# Patient Record
Sex: Female | Born: 1985 | Race: Black or African American | Hispanic: No | Marital: Single | Smoking: Current every day smoker
Health system: Southern US, Community
[De-identification: ages and names within clinical notes are randomized; demographics above are authoritative.]

## PROBLEM LIST (undated history)

## (undated) ENCOUNTER — Inpatient Hospital Stay (HOSPITAL_COMMUNITY): Payer: Self-pay

## (undated) DIAGNOSIS — R519 Headache, unspecified: Secondary | ICD-10-CM

## (undated) DIAGNOSIS — R51 Headache: Secondary | ICD-10-CM

## (undated) DIAGNOSIS — D649 Anemia, unspecified: Secondary | ICD-10-CM

## (undated) DIAGNOSIS — Z789 Other specified health status: Secondary | ICD-10-CM

---
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
## 2005-01-11 ENCOUNTER — Emergency Department (HOSPITAL_COMMUNITY): Admission: EM | Admit: 2005-01-11 | Discharge: 2005-01-11 | Payer: Self-pay | Admitting: Emergency Medicine

## 2005-02-09 ENCOUNTER — Other Ambulatory Visit: Admission: RE | Admit: 2005-02-09 | Discharge: 2005-02-09 | Payer: Self-pay | Admitting: Obstetrics and Gynecology

## 2005-06-29 ENCOUNTER — Inpatient Hospital Stay (HOSPITAL_COMMUNITY): Admission: AD | Admit: 2005-06-29 | Discharge: 2005-07-02 | Payer: Self-pay | Admitting: Obstetrics and Gynecology

## 2005-06-30 DIAGNOSIS — O1490 Unspecified pre-eclampsia, unspecified trimester: Secondary | ICD-10-CM

## 2006-08-19 ENCOUNTER — Emergency Department (HOSPITAL_COMMUNITY): Admission: EM | Admit: 2006-08-19 | Discharge: 2006-08-19 | Payer: Self-pay | Admitting: Emergency Medicine

## 2006-09-03 ENCOUNTER — Emergency Department (HOSPITAL_COMMUNITY): Admission: EM | Admit: 2006-09-03 | Discharge: 2006-09-03 | Payer: Self-pay | Admitting: Emergency Medicine

## 2006-10-21 ENCOUNTER — Ambulatory Visit: Payer: Self-pay | Admitting: Obstetrics and Gynecology

## 2006-10-21 ENCOUNTER — Inpatient Hospital Stay (HOSPITAL_COMMUNITY): Admission: AD | Admit: 2006-10-21 | Discharge: 2006-10-23 | Payer: Self-pay | Admitting: Gynecology

## 2006-10-22 ENCOUNTER — Encounter (INDEPENDENT_AMBULATORY_CARE_PROVIDER_SITE_OTHER): Payer: Self-pay | Admitting: Gynecology

## 2007-08-31 ENCOUNTER — Emergency Department (HOSPITAL_COMMUNITY): Admission: EM | Admit: 2007-08-31 | Discharge: 2007-08-31 | Payer: Self-pay | Admitting: Emergency Medicine

## 2007-11-19 ENCOUNTER — Inpatient Hospital Stay (HOSPITAL_COMMUNITY): Admission: AD | Admit: 2007-11-19 | Discharge: 2007-11-19 | Payer: Self-pay | Admitting: Obstetrics & Gynecology

## 2007-11-19 ENCOUNTER — Ambulatory Visit: Payer: Self-pay | Admitting: Physician Assistant

## 2007-11-25 ENCOUNTER — Ambulatory Visit (HOSPITAL_COMMUNITY): Admission: RE | Admit: 2007-11-25 | Discharge: 2007-11-25 | Payer: Self-pay | Admitting: Family Medicine

## 2007-11-26 ENCOUNTER — Encounter: Payer: Self-pay | Admitting: Obstetrics and Gynecology

## 2007-11-26 ENCOUNTER — Ambulatory Visit: Payer: Self-pay | Admitting: Obstetrics & Gynecology

## 2007-12-09 ENCOUNTER — Ambulatory Visit (HOSPITAL_COMMUNITY): Admission: RE | Admit: 2007-12-09 | Discharge: 2007-12-09 | Payer: Self-pay | Admitting: Family Medicine

## 2007-12-30 ENCOUNTER — Ambulatory Visit (HOSPITAL_COMMUNITY): Admission: RE | Admit: 2007-12-30 | Discharge: 2007-12-30 | Payer: Self-pay | Admitting: Family Medicine

## 2007-12-31 ENCOUNTER — Ambulatory Visit: Payer: Self-pay | Admitting: Obstetrics & Gynecology

## 2007-12-31 ENCOUNTER — Encounter: Payer: Self-pay | Admitting: Obstetrics and Gynecology

## 2008-01-01 ENCOUNTER — Encounter: Payer: Self-pay | Admitting: Obstetrics and Gynecology

## 2008-01-01 LAB — CONVERTED CEMR LAB
Amphetamine Screen, Ur: NEGATIVE
Cocaine Metabolites: NEGATIVE
Methadone: NEGATIVE
Opiate Screen, Urine: NEGATIVE
Phencyclidine (PCP): NEGATIVE
Propoxyphene: NEGATIVE

## 2008-01-07 ENCOUNTER — Ambulatory Visit: Payer: Self-pay | Admitting: Obstetrics & Gynecology

## 2008-01-21 ENCOUNTER — Encounter: Payer: Self-pay | Admitting: Obstetrics and Gynecology

## 2008-01-21 ENCOUNTER — Ambulatory Visit: Payer: Self-pay | Admitting: Obstetrics & Gynecology

## 2008-01-21 LAB — CONVERTED CEMR LAB
Barbiturate Quant, Ur: NEGATIVE
Cocaine Metabolites: NEGATIVE
Creatinine,U: 164.5 mg/dL
Marijuana Metabolite: NEGATIVE
Methadone: NEGATIVE
Phencyclidine (PCP): NEGATIVE
Propoxyphene: NEGATIVE

## 2008-02-04 ENCOUNTER — Ambulatory Visit: Payer: Self-pay | Admitting: Obstetrics & Gynecology

## 2008-02-04 ENCOUNTER — Encounter: Payer: Self-pay | Admitting: Obstetrics and Gynecology

## 2008-02-05 ENCOUNTER — Ambulatory Visit: Payer: Self-pay | Admitting: Obstetrics and Gynecology

## 2008-02-05 ENCOUNTER — Inpatient Hospital Stay (HOSPITAL_COMMUNITY): Admission: AD | Admit: 2008-02-05 | Discharge: 2008-02-05 | Payer: Self-pay | Admitting: Obstetrics & Gynecology

## 2008-02-07 ENCOUNTER — Ambulatory Visit: Payer: Self-pay | Admitting: Advanced Practice Midwife

## 2008-02-07 ENCOUNTER — Inpatient Hospital Stay (HOSPITAL_COMMUNITY): Admission: AD | Admit: 2008-02-07 | Discharge: 2008-02-09 | Payer: Self-pay | Admitting: Obstetrics & Gynecology

## 2009-12-19 ENCOUNTER — Inpatient Hospital Stay (HOSPITAL_COMMUNITY): Admission: AD | Admit: 2009-12-19 | Discharge: 2009-12-19 | Payer: Self-pay | Admitting: Obstetrics & Gynecology

## 2009-12-19 ENCOUNTER — Ambulatory Visit: Payer: Self-pay | Admitting: Nurse Practitioner

## 2010-04-18 LAB — URINALYSIS, ROUTINE W REFLEX MICROSCOPIC
Nitrite: NEGATIVE
Protein, ur: NEGATIVE mg/dL

## 2010-04-18 LAB — CBC
HCT: 33.1 % — ABNORMAL LOW (ref 36.0–46.0)
Hemoglobin: 11.2 g/dL — ABNORMAL LOW (ref 12.0–15.0)
MCHC: 33.7 g/dL (ref 30.0–36.0)
Platelets: 162 10*3/uL (ref 150–400)
RBC: 3.81 MIL/uL — ABNORMAL LOW (ref 3.87–5.11)

## 2010-04-18 LAB — WET PREP, GENITAL
Trich, Wet Prep: NONE SEEN
Yeast Wet Prep HPF POC: NONE SEEN

## 2010-05-22 LAB — RPR: RPR Ser Ql: NONREACTIVE

## 2010-05-22 LAB — CBC
HCT: 30.4 % — ABNORMAL LOW (ref 36.0–46.0)
Platelets: 186 10*3/uL (ref 150–400)
RBC: 3.91 MIL/uL (ref 3.87–5.11)

## 2010-06-20 NOTE — Discharge Summary (Signed)
NAME:  Heather Good, Heather Good NO.:  [id number]   MEDICAL RECORD NO.:  [id number]          PATIENT TYPE:  INP   LOCATION:  9304                          FACILITY:  [facility]   PHYSICIAN:  Ginger Carne, MD  DATE OF BIRTH:  Mar 31, 1985   DATE OF ADMISSION:  10/21/2006  DATE OF DISCHARGE:  10/23/2006                               DISCHARGE SUMMARY   ADMISSION DIAGNOSIS:  Spontaneous abortion at 46 weeks' gestational age.   DISCHARGE DIAGNOSES:  1. Postpartum day #2 spontaneous abortion at 12 weeks' gestation.  2. Chorioamnionitis.  3. Placental abruption.  4. Acute blood loss anemia.   DISCHARGE LABORATORIES:  1. Rubella immune.  2. Hepatitis B surface antigen negative.  3. Urine drug screen positive for marijuana.  4. Blood type O positive.  5. Hemoglobin 7.1 on September 16.  6. White blood cell count 24.5 on September 16.   DISCHARGE MEDICATIONS:  1. Ibuprofen 600 mg one tab p.o. q.6 hours. p.r.n. pain.  2. Iron sulfate 325 mg one tab p.o. daily.  3. Colace 100 mg one tab p.o. b.i.d. p.r.n. constipation.   HOSPITAL COURSE:  The patient is a 25 year old G5, P2-0-3-2 who is  admitted with complaint of vaginal bleeding at 18 weeks' gestation.  She  did not have any prenatal care.  She spontaneously expelled fetus on  October 22, 2006 at 0255 while doing urine collection.  Fetus was a  female at approximately 200 g.  There were no signs of life and no gross  abnormalities.  The patient passed large clots, and large clots were  manually removed.  The patient also had retained placenta which  eventually passed with maternal pushing.  There were no lacerations or  episiotomies.  The patient's amniotic fluid was clear.  The placenta was  sent to pathology.  During her hospital stay the patient did become  febrile and was placed on Unasyn and Flagyl.  She also was anemic  following her spontaneous abortion with a hemoglobin level on the day  prior to discharge at 7.1.  Her  white count peaked at 32.6, but trended  down and was 24.5 on day prior to discharge.  On day of discharge  patient was stable and afebrile and without complaints.  She was  determined to be stable by day of discharge and was sent home with p.o.  iron for her anemia.  The patient is getting an Implanon for birth  control, and she will follow up at the [facility] Department  in 6 weeks.  She is Rh positive and did not need RhoGAM.  She was  rubella immune and did not require a rubella vaccination.   DISCHARGE INSTRUCTIONS:  1. The patient is to take all medications as prescribed.  2. No heavy lifting x6 weeks.  3. Nothing in vagina x6 weeks.  4. Patient is to follow up at the [facility] Department in      six weeks.   DISPOSITION:  The patient is discharged to home in stable condition.      Lauro Franklin, MD  Ginger Carne, MD  Electronically Signed    TCB/MEDQ  D:  10/23/2006  T:  10/23/2006  Job:  [id number]
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
## 2010-06-23 NOTE — Op Note (Signed)
NAME:  Heather Good, Heather Good NO.:  [id number]   MEDICAL RECORD NO.:  [id number]          PATIENT TYPE:  INP   LOCATION:  9104                          [facility]   PHYSICIAN:  Charles A. Delcambre, MDDATE OF BIRTH:  January 19, 1986   DATE OF PROCEDURE:  06/30/2005  DATE OF DISCHARGE:                                 OPERATIVE REPORT   DELIVERY NOTE:  The patient is a 25 year old, para 1-0-2-1, [facility] Jul 04, 2005 at 39 weeks and  2 days estimated gestational age, presented with spontaneous rupture of  membranes with clear fluid at 12:30 on Jun 29, 2005.  She was having  occasional contractions every four to five minutes but was noted to be  contracting every four to five minutes, mild.  She was 3 cm in maternity  admission unit and was admitted.  She was brought to labor and delivery  sometime later once a bed was available and was noted to be 4 cm dilated.  She was given Pitocin but failed to change much over the next approximately  six hours and thereafter was given an epidural at her request.  After the  epidural, cervix was checked and she was 4 cm.  A short while later I was  called to place in [facility] to monitor her further contractions closely with  Pitocin.  It appeared that she was contracting every four minutes and  approximately every four minutes with reactive fetal heart rate.  She did  have transient deceleration after blood pressure dropped with epidural.  This resolved after IV fluid and oxygen was placed.  Upon my arrival at  78, to place IUPC, she noted to be complete/complete, +2 station.  She was  felt to have onset of labor at 0430 and she pushed to spontaneous vaginal  delivery at 0829.  Placenta followed spontaneously at 10.  She had a  vigorous female with Apgars of 8 and 9.  Cord arterial blood gas returned  7.18 pH.  Placenta was spontaneous three-vessel and intact.  There was noted  complication on delivery.  Periclitoral extending down to  periurethal  laceration with a major bleeder at the periclitoral area and also bleeding  substantially at the periurethal area.  A 3-0 Vicryl on a SH needle was used  to suture these areas carefully with good hemostasis resulting.  The sutures  were very close to the urethral area as was required to achieve hemostasis.  Catheter was passed without difficulty.  There was no evidence of resistance  or blockage and after repair was completed, sutures were intact without  obstruction of urethra.  Hemostasis was excellent.  Will consider if the  patient has any difficulty voiding, resultant edema from the laceration with  sutures present.  Will go ahead and place catheter and in my judgment, she  should not have difficulty voiding from the suture nor any interruption of  the urethra with the sutures.  Estimated blood loss from the periclitoral  and periurethral lacerations for the most part in conjunction with the  uterine bleeding postpartum approximately 500 mL with the laceration  contributing approximately  200 mL, very small lacerations and there were  significant bleeders encountered.  Mother and baby recovering and stable at  this time.      Charles A. Sydnee Cabal, MD  Electronically Signed     CAD/MEDQ  D:  06/30/2005  T:  07/01/2005  Job:  [id number]
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
## 2010-11-06 LAB — POCT URINALYSIS DIP (DEVICE)
Glucose, UA: NEGATIVE
Hgb urine dipstick: NEGATIVE
Specific Gravity, Urine: 1.02

## 2010-11-06 LAB — CBC
HCT: 28.8 — ABNORMAL LOW
Hemoglobin: 9.4 — ABNORMAL LOW
MCHC: 32.7
MCV: 79.8
RDW: 18.7 — ABNORMAL HIGH
WBC: 12.4 — ABNORMAL HIGH

## 2010-11-06 LAB — DIFFERENTIAL
Basophils Absolute: 0
Neutro Abs: 9.4 — ABNORMAL HIGH
Neutrophils Relative %: 76

## 2010-11-06 LAB — URINALYSIS, ROUTINE W REFLEX MICROSCOPIC
Glucose, UA: NEGATIVE
Hgb urine dipstick: NEGATIVE
Nitrite: NEGATIVE
Specific Gravity, Urine: 1.025
Urobilinogen, UA: 4 — ABNORMAL HIGH

## 2010-11-06 LAB — HIV ANTIBODY (ROUTINE TESTING W REFLEX): HIV: NONREACTIVE

## 2010-11-06 LAB — TYPE AND SCREEN: ABO/RH(D): O POS

## 2010-11-06 LAB — RPR: RPR Ser Ql: NONREACTIVE

## 2010-11-06 LAB — RAPID URINE DRUG SCREEN, HOSP PERFORMED
Barbiturates: NOT DETECTED
Cocaine: NOT DETECTED
Tetrahydrocannabinol: POSITIVE — AB

## 2010-11-07 LAB — POCT URINALYSIS DIP (DEVICE)
Bilirubin Urine: NEGATIVE
Hgb urine dipstick: NEGATIVE
Ketones, ur: NEGATIVE
Specific Gravity, Urine: 1.015
pH: 7.5

## 2010-11-09 LAB — POCT URINALYSIS DIP (DEVICE)
Bilirubin Urine: NEGATIVE
Hgb urine dipstick: NEGATIVE
Hgb urine dipstick: NEGATIVE
Ketones, ur: NEGATIVE mg/dL
Ketones, ur: NEGATIVE mg/dL
Nitrite: NEGATIVE
Protein, ur: 30 mg/dL — AB
Protein, ur: NEGATIVE mg/dL
Specific Gravity, Urine: 1.015 (ref 1.005–1.030)
Specific Gravity, Urine: 1.015 (ref 1.005–1.030)
Specific Gravity, Urine: 1.015 (ref 1.005–1.030)
Urobilinogen, UA: 1 mg/dL (ref 0.0–1.0)
Urobilinogen, UA: 4 mg/dL — ABNORMAL HIGH (ref 0.0–1.0)
pH: 7 (ref 5.0–8.0)
pH: 7 (ref 5.0–8.0)

## 2010-11-16 LAB — CBC
HCT: 22.2 — ABNORMAL LOW
Hemoglobin: 7.1 — CL
Hemoglobin: 7.5 — CL
Hemoglobin: 8.3 — ABNORMAL LOW
MCHC: 33
MCV: 80.7
Platelets: 249
RBC: 2.66 — ABNORMAL LOW
RDW: 15.9 — ABNORMAL HIGH
RDW: 16 — ABNORMAL HIGH
WBC: 24.5 — ABNORMAL HIGH
WBC: 32.6 — ABNORMAL HIGH

## 2010-11-16 LAB — RAPID URINE DRUG SCREEN, HOSP PERFORMED
Amphetamines: NOT DETECTED
Barbiturates: NOT DETECTED
Benzodiazepines: NOT DETECTED
Opiates: NOT DETECTED
Tetrahydrocannabinol: POSITIVE — AB

## 2010-11-16 LAB — HEPATITIS B SURFACE ANTIGEN: Hepatitis B Surface Ag: NEGATIVE

## 2010-11-16 LAB — URINALYSIS, ROUTINE W REFLEX MICROSCOPIC
Bilirubin Urine: NEGATIVE
Glucose, UA: NEGATIVE
Hgb urine dipstick: NEGATIVE
Specific Gravity, Urine: 1.02
Urobilinogen, UA: 2 — ABNORMAL HIGH

## 2010-11-16 LAB — DIFFERENTIAL
Basophils Absolute: 0
Basophils Relative: 0
Basophils Relative: 0
Eosinophils Absolute: 0
Eosinophils Absolute: 0
Eosinophils Relative: 0
Eosinophils Relative: 0
Lymphocytes Relative: 3 — ABNORMAL LOW
Lymphocytes Relative: 5 — ABNORMAL LOW
Lymphocytes Relative: 6 — ABNORMAL LOW
Monocytes Absolute: 1.6 — ABNORMAL HIGH
Monocytes Relative: 5
Neutro Abs: 21.8 — ABNORMAL HIGH
Neutro Abs: 24.7 — ABNORMAL HIGH
Neutrophils Relative %: 89 — ABNORMAL HIGH
Neutrophils Relative %: 90 — ABNORMAL HIGH
Neutrophils Relative %: 92 — ABNORMAL HIGH
WBC Morphology: INCREASED

## 2010-11-16 LAB — SICKLE CELL SCREEN: Sickle Cell Screen: NEGATIVE

## 2010-11-16 LAB — TYPE AND SCREEN: Antibody Screen: NEGATIVE

## 2010-11-20 LAB — I-STAT 8, (EC8 V) (CONVERTED LAB)
Acid-base deficit: 4 — ABNORMAL HIGH
BUN: 8
Chloride: 106
Glucose, Bld: 92
Potassium: 3.6
pH, Ven: 7.385 — ABNORMAL HIGH

## 2010-11-20 LAB — CBC
HCT: 35.3 — ABNORMAL LOW
MCHC: 33.4
MCV: 81.6
RBC: 4.32
WBC: 13.6 — ABNORMAL HIGH

## 2010-11-20 LAB — DIFFERENTIAL
Basophils Relative: 0
Eosinophils Absolute: 0.3
Eosinophils Relative: 2
Lymphs Abs: 3.3
Monocytes Absolute: 0.8 — ABNORMAL HIGH
Monocytes Relative: 6

## 2010-11-20 LAB — WET PREP, GENITAL
Trich, Wet Prep: NONE SEEN
WBC, Wet Prep HPF POC: NONE SEEN
Yeast Wet Prep HPF POC: NONE SEEN

## 2010-11-20 LAB — GC/CHLAMYDIA PROBE AMP, GENITAL
Chlamydia, DNA Probe: NEGATIVE
GC Probe Amp, Genital: NEGATIVE

## 2010-11-21 LAB — ABO/RH: ABO/RH(D): O POS

## 2010-11-21 LAB — URINE MICROSCOPIC-ADD ON

## 2010-11-21 LAB — URINALYSIS, ROUTINE W REFLEX MICROSCOPIC
Bilirubin Urine: NEGATIVE
Glucose, UA: NEGATIVE
Ketones, ur: NEGATIVE
Leukocytes, UA: NEGATIVE
Protein, ur: NEGATIVE
pH: 7

## 2010-11-21 LAB — GC/CHLAMYDIA PROBE AMP, GENITAL: GC Probe Amp, Genital: NEGATIVE

## 2010-11-21 LAB — WET PREP, GENITAL
WBC, Wet Prep HPF POC: NONE SEEN
Yeast Wet Prep HPF POC: NONE SEEN

## 2011-09-23 ENCOUNTER — Inpatient Hospital Stay (HOSPITAL_COMMUNITY)
Admission: AD | Admit: 2011-09-23 | Discharge: 2011-09-23 | Disposition: A | Discharge status: Home / Self Care | Payer: Medicaid Other | Source: Ambulatory Visit | Attending: Obstetrics and Gynecology | Admitting: Obstetrics and Gynecology

## 2011-09-23 ENCOUNTER — Inpatient Hospital Stay (HOSPITAL_COMMUNITY): Payer: Medicaid Other

## 2011-09-23 ENCOUNTER — Encounter (HOSPITAL_COMMUNITY): Payer: Self-pay | Admitting: Obstetrics and Gynecology

## 2011-09-23 DIAGNOSIS — Z331 Pregnant state, incidental: Secondary | ICD-10-CM

## 2011-09-23 DIAGNOSIS — M545 Low back pain, unspecified: Secondary | ICD-10-CM | POA: Insufficient documentation

## 2011-09-23 DIAGNOSIS — O99891 Other specified diseases and conditions complicating pregnancy: Secondary | ICD-10-CM | POA: Insufficient documentation

## 2011-09-23 DIAGNOSIS — R109 Unspecified abdominal pain: Secondary | ICD-10-CM | POA: Insufficient documentation

## 2011-09-23 DIAGNOSIS — Z3490 Encounter for supervision of normal pregnancy, unspecified, unspecified trimester: Secondary | ICD-10-CM

## 2011-09-23 HISTORY — DX: Other specified health status: Z78.9

## 2011-09-23 LAB — WET PREP, GENITAL
Trich, Wet Prep: NONE SEEN
Yeast Wet Prep HPF POC: NONE SEEN

## 2011-09-23 LAB — URINALYSIS, ROUTINE W REFLEX MICROSCOPIC
Bilirubin Urine: NEGATIVE
Glucose, UA: NEGATIVE mg/dL
Hgb urine dipstick: NEGATIVE
Ketones, ur: NEGATIVE mg/dL
Leukocytes, UA: NEGATIVE
Nitrite: NEGATIVE
Protein, ur: NEGATIVE mg/dL
Specific Gravity, Urine: 1.02 (ref 1.005–1.030)
Urobilinogen, UA: 1 mg/dL (ref 0.0–1.0)
pH: 7 (ref 5.0–8.0)

## 2011-09-23 LAB — CBC
HCT: 34.8 % — ABNORMAL LOW (ref 36.0–46.0)
Hemoglobin: 11.6 g/dL — ABNORMAL LOW (ref 12.0–15.0)
MCH: 27.8 pg (ref 26.0–34.0)
MCHC: 33.3 g/dL (ref 30.0–36.0)
MCV: 83.5 fL (ref 78.0–100.0)
Platelets: 214 10*3/uL (ref 150–400)
RBC: 4.17 MIL/uL (ref 3.87–5.11)
RDW: 14.2 % (ref 11.5–15.5)
WBC: 11.5 10*3/uL — ABNORMAL HIGH (ref 4.0–10.5)

## 2011-09-23 LAB — HCG, QUANTITATIVE, PREGNANCY: hCG, Beta Chain, Quant, S: 7271 m[IU]/mL — ABNORMAL HIGH (ref <5)

## 2011-09-23 LAB — ABO/RH: ABO/RH(D): O POS

## 2011-09-23 LAB — POCT PREGNANCY, URINE: Preg Test, Ur: POSITIVE — AB

## 2011-09-23 NOTE — MAU Note (Signed)
Pt reports having irregular periods  No period in June or July and had bleeding on 8/5-8/16. Now having abd pain and cramping

## 2011-09-23 NOTE — MAU Note (Signed)
"  I have been having lower back and abd pain for the past 4 days.  I spotted a couple of times yesterday and earlier this morning.  I think I might be pregnant."
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
## 2011-09-23 NOTE — Progress Notes (Signed)
Fundus: 16 cm

## 2011-09-24 LAB — GC/CHLAMYDIA PROBE AMP, GENITAL
Chlamydia, DNA Probe: NEGATIVE
GC Probe Amp, Genital: NEGATIVE

## 2011-10-02 NOTE — MAU Provider Note (Signed)
Attestation of Attending Supervision of Advanced Practitioner: Evaluation and management procedures were performed by the PA/NP/CNM/OB Fellow under my supervision/collaboration. Chart reviewed and agree with management and plan.  Alistair Senft V 10/02/2011 7:04 PM    

## 2011-11-20 ENCOUNTER — Inpatient Hospital Stay (HOSPITAL_COMMUNITY)
Admission: AD | Admit: 2011-11-20 | Discharge: 2011-11-20 | Disposition: A | Discharge status: Home / Self Care | Payer: Medicaid Other | Source: Ambulatory Visit | Attending: Obstetrics & Gynecology | Admitting: Obstetrics & Gynecology

## 2011-11-20 ENCOUNTER — Encounter (HOSPITAL_COMMUNITY): Payer: Self-pay

## 2011-11-20 DIAGNOSIS — N949 Unspecified condition associated with female genital organs and menstrual cycle: Secondary | ICD-10-CM

## 2011-11-20 DIAGNOSIS — R109 Unspecified abdominal pain: Secondary | ICD-10-CM | POA: Insufficient documentation

## 2011-11-20 DIAGNOSIS — O99891 Other specified diseases and conditions complicating pregnancy: Secondary | ICD-10-CM | POA: Insufficient documentation

## 2011-11-20 LAB — URINALYSIS, ROUTINE W REFLEX MICROSCOPIC
Bilirubin Urine: NEGATIVE
Glucose, UA: NEGATIVE mg/dL
Hgb urine dipstick: NEGATIVE
Ketones, ur: NEGATIVE mg/dL
Leukocytes, UA: NEGATIVE
Nitrite: NEGATIVE
Protein, ur: NEGATIVE mg/dL
Specific Gravity, Urine: 1.02 (ref 1.005–1.030)
Urobilinogen, UA: 1 mg/dL (ref 0.0–1.0)
pH: 7.5 (ref 5.0–8.0)

## 2011-11-20 MED ORDER — PRENATAL VITAMINS 0.8 MG PO TABS: 1.0000 | ORAL_TABLET | Freq: Every day | ORAL | Status: DC

## 2011-11-20 MED ORDER — RANITIDINE HCL 150 MG PO CAPS: 150.0000 mg | ORAL_CAPSULE | Freq: Every day | ORAL | Status: DC

## 2011-11-20 NOTE — MAU Note (Signed)
Dr. Thad Ranger into see patient

## 2011-11-20 NOTE — MAU Note (Signed)
Patient states she has not had prenatal care pending Medicaid approval. Needs a confirmation of pregnancy letter. States she is not having any problems. Denies any bleeding or leaking. States she has been feeling fetal movement, but not consistent. Sometimes has pain at night. No pain at this time.

## 2011-11-20 NOTE — MAU Note (Signed)
Off efm for bedside portable ultrasound by Dr. Thad Ranger.
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
## 2011-12-19 LAB — OB RESULTS CONSOLE RPR: RPR: NONREACTIVE

## 2012-02-06 NOTE — L&D Delivery Note (Signed)
Delivery Note At 2:00 PM a viable female was delivered via Vaginal, Spontaneous Delivery (Presentation: Left Occiput Anterior).  APGAR: 8, 9; weight .   Placenta status: Intact, Spontaneous.  Cord: 3 vessels with the following complications: None.  Cord pH: none  Anesthesia: Epidural  Episiotomy: None Lacerations: None Suture Repair: none Est. Blood Loss (mL): 300  Mom to postpartum.  Baby to nursery-stable.  Raisha Brabender A 02/21/2012, 2:26 PM

## 2012-02-18 LAB — OB RESULTS CONSOLE GBS: GBS: NEGATIVE

## 2012-02-20 ENCOUNTER — Inpatient Hospital Stay (HOSPITAL_COMMUNITY)
Admission: AD | Admit: 2012-02-20 | Discharge: 2012-02-23 | DRG: 775 | Disposition: A | Discharge status: Home / Self Care | Payer: Medicaid Other | Source: Ambulatory Visit | Attending: Obstetrics & Gynecology | Admitting: Obstetrics & Gynecology

## 2012-02-20 ENCOUNTER — Encounter (HOSPITAL_COMMUNITY): Payer: Self-pay | Admitting: *Deleted

## 2012-02-20 DIAGNOSIS — O4290 Premature rupture of membranes, unspecified as to length of time between rupture and onset of labor, unspecified weeks of gestation: Secondary | ICD-10-CM | POA: Diagnosis present

## 2012-02-20 HISTORY — DX: Anemia, unspecified: D64.9

## 2012-02-20 MED ORDER — IBUPROFEN 600 MG PO TABS
600.0000 mg | ORAL_TABLET | Freq: Four times a day (QID) | ORAL | Status: DC | PRN
  Administered 2012-02-21: 600 mg via ORAL
  Filled 2012-02-20: qty 1

## 2012-02-20 MED ORDER — OXYTOCIN 40 UNITS IN LACTATED RINGERS INFUSION - SIMPLE MED
62.5000 mL/h | INTRAVENOUS | Status: DC
  Filled 2012-02-20 (×2): qty 1000

## 2012-02-20 MED ORDER — MISOPROSTOL 25 MCG QUARTER TABLET
75.0000 ug | ORAL_TABLET | ORAL | Status: DC
  Filled 2012-02-20: qty 0.75
  Filled 2012-02-20: qty 1

## 2012-02-20 MED ORDER — LIDOCAINE HCL (PF) 1 % IJ SOLN
30.0000 mL | INTRAMUSCULAR | Status: DC | PRN
  Filled 2012-02-20 (×3): qty 30

## 2012-02-20 MED ORDER — LACTATED RINGERS IV SOLN
500.0000 mL | INTRAVENOUS | Status: DC | PRN
  Administered 2012-02-21: 300 mL via INTRAVENOUS

## 2012-02-20 MED ORDER — ACETAMINOPHEN 325 MG PO TABS: 650.0000 mg | ORAL_TABLET | ORAL | Status: DC | PRN

## 2012-02-20 MED ORDER — ONDANSETRON HCL 4 MG/2ML IJ SOLN: 4.0000 mg | Freq: Four times a day (QID) | INTRAMUSCULAR | Status: DC | PRN

## 2012-02-20 MED ORDER — OXYTOCIN BOLUS FROM INFUSION
500.0000 mL | INTRAVENOUS | Status: DC
  Administered 2012-02-21: 500 mL via INTRAVENOUS

## 2012-02-20 MED ORDER — LACTATED RINGERS IV SOLN
INTRAVENOUS | Status: DC
  Administered 2012-02-21 (×3): via INTRAVENOUS

## 2012-02-20 MED ORDER — CITRIC ACID-SODIUM CITRATE 334-500 MG/5ML PO SOLN: 30.0000 mL | ORAL | Status: DC | PRN

## 2012-02-20 MED ORDER — OXYCODONE-ACETAMINOPHEN 5-325 MG PO TABS: 1.0000 | ORAL_TABLET | ORAL | Status: DC | PRN

## 2012-02-20 NOTE — MAU Note (Signed)
Pt states her water broke about 2000 requiring her to change her clothes.  After arriving in MAU, pt needed to change again.  Reports clear fluid,  Denies vaginal bleeding.  Good fetal movement.
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
## 2012-02-21 ENCOUNTER — Encounter (HOSPITAL_COMMUNITY): Payer: Self-pay | Admitting: Anesthesiology

## 2012-02-21 ENCOUNTER — Inpatient Hospital Stay (HOSPITAL_COMMUNITY): Payer: Medicaid Other | Admitting: Anesthesiology

## 2012-02-21 ENCOUNTER — Encounter (HOSPITAL_COMMUNITY): Payer: Self-pay

## 2012-02-21 LAB — CBC
MCH: 27.4 pg (ref 26.0–34.0)
MCHC: 34.8 g/dL (ref 30.0–36.0)
MCV: 78.9 fL (ref 78.0–100.0)
Platelets: 179 10*3/uL (ref 150–400)
RDW: 15.5 % (ref 11.5–15.5)

## 2012-02-21 LAB — RPR: RPR Ser Ql: NONREACTIVE

## 2012-02-21 MED ORDER — LIDOCAINE HCL (PF) 1 % IJ SOLN
INTRAMUSCULAR | Status: DC | PRN
  Administered 2012-02-21 (×2): 4 mL

## 2012-02-21 MED ORDER — IBUPROFEN 600 MG PO TABS
600.0000 mg | ORAL_TABLET | Freq: Four times a day (QID) | ORAL | Status: DC
  Administered 2012-02-22 – 2012-02-23 (×6): 600 mg via ORAL
  Filled 2012-02-21 (×6): qty 1

## 2012-02-21 MED ORDER — OXYTOCIN 40 UNITS IN LACTATED RINGERS INFUSION - SIMPLE MED: 62.5000 mL/h | INTRAVENOUS | Status: DC | PRN

## 2012-02-21 MED ORDER — LANOLIN HYDROUS EX OINT: TOPICAL_OINTMENT | CUTANEOUS | Status: DC | PRN

## 2012-02-21 MED ORDER — FENTANYL 2.5 MCG/ML BUPIVACAINE 1/10 % EPIDURAL INFUSION (WH - ANES)
14.0000 mL/h | INTRAMUSCULAR | Status: DC
  Administered 2012-02-21: 14 mL/h via EPIDURAL
  Filled 2012-02-21 (×2): qty 125

## 2012-02-21 MED ORDER — LACTATED RINGERS IV SOLN: 500.0000 mL | Freq: Once | INTRAVENOUS | Status: DC

## 2012-02-21 MED ORDER — DIBUCAINE 1 % RE OINT: 1.0000 "application " | TOPICAL_OINTMENT | RECTAL | Status: DC | PRN

## 2012-02-21 MED ORDER — ZOLPIDEM TARTRATE 5 MG PO TABS: 5.0000 mg | ORAL_TABLET | Freq: Every evening | ORAL | Status: DC | PRN

## 2012-02-21 MED ORDER — EPHEDRINE 5 MG/ML INJ
10.0000 mg | INTRAVENOUS | Status: DC | PRN
  Filled 2012-02-21: qty 2

## 2012-02-21 MED ORDER — TETANUS-DIPHTH-ACELL PERTUSSIS 5-2.5-18.5 LF-MCG/0.5 IM SUSP
0.5000 mL | Freq: Once | INTRAMUSCULAR | Status: AC
  Administered 2012-02-22: 0.5 mL via INTRAMUSCULAR
  Filled 2012-02-21: qty 0.5

## 2012-02-21 MED ORDER — OXYCODONE-ACETAMINOPHEN 5-325 MG PO TABS: 1.0000 | ORAL_TABLET | ORAL | Status: DC | PRN

## 2012-02-21 MED ORDER — PHENYLEPHRINE 40 MCG/ML (10ML) SYRINGE FOR IV PUSH (FOR BLOOD PRESSURE SUPPORT)
80.0000 ug | PREFILLED_SYRINGE | INTRAVENOUS | Status: DC | PRN
  Filled 2012-02-21: qty 2
  Filled 2012-02-21: qty 5

## 2012-02-21 MED ORDER — PNEUMOCOCCAL VAC POLYVALENT 25 MCG/0.5ML IJ INJ
0.5000 mL | INJECTION | INTRAMUSCULAR | Status: AC
  Filled 2012-02-21 (×2): qty 0.5

## 2012-02-21 MED ORDER — ONDANSETRON HCL 4 MG/2ML IJ SOLN: 4.0000 mg | INTRAMUSCULAR | Status: DC | PRN

## 2012-02-21 MED ORDER — WITCH HAZEL-GLYCERIN EX PADS: 1.0000 "application " | MEDICATED_PAD | CUTANEOUS | Status: DC | PRN

## 2012-02-21 MED ORDER — EPHEDRINE 5 MG/ML INJ
10.0000 mg | INTRAVENOUS | Status: DC | PRN
  Filled 2012-02-21: qty 4
  Filled 2012-02-21: qty 2

## 2012-02-21 MED ORDER — MEDROXYPROGESTERONE ACETATE 150 MG/ML IM SUSP: 150.0000 mg | INTRAMUSCULAR | Status: DC | PRN

## 2012-02-21 MED ORDER — PHENYLEPHRINE 40 MCG/ML (10ML) SYRINGE FOR IV PUSH (FOR BLOOD PRESSURE SUPPORT)
80.0000 ug | PREFILLED_SYRINGE | INTRAVENOUS | Status: DC | PRN
  Filled 2012-02-21: qty 2

## 2012-02-21 MED ORDER — PRENATAL MULTIVITAMIN CH
1.0000 | ORAL_TABLET | Freq: Every day | ORAL | Status: DC
  Administered 2012-02-21 – 2012-02-23 (×3): 1 via ORAL
  Filled 2012-02-21 (×3): qty 1

## 2012-02-21 MED ORDER — ONDANSETRON HCL 4 MG PO TABS: 4.0000 mg | ORAL_TABLET | ORAL | Status: DC | PRN

## 2012-02-21 MED ORDER — SIMETHICONE 80 MG PO CHEW: 80.0000 mg | CHEWABLE_TABLET | ORAL | Status: DC | PRN

## 2012-02-21 MED ORDER — FENTANYL 2.5 MCG/ML BUPIVACAINE 1/10 % EPIDURAL INFUSION (WH - ANES)
INTRAMUSCULAR | Status: DC | PRN
  Administered 2012-02-21: 14 mL/h via EPIDURAL

## 2012-02-21 MED ORDER — OXYTOCIN 40 UNITS IN LACTATED RINGERS INFUSION - SIMPLE MED
1.0000 m[IU]/min | INTRAVENOUS | Status: DC
  Administered 2012-02-21: 1 m[IU]/min via INTRAVENOUS

## 2012-02-21 MED ORDER — SENNOSIDES-DOCUSATE SODIUM 8.6-50 MG PO TABS
2.0000 | ORAL_TABLET | Freq: Every day | ORAL | Status: DC
  Administered 2012-02-21: 2 via ORAL

## 2012-02-21 MED ORDER — DIPHENHYDRAMINE HCL 50 MG/ML IJ SOLN: 12.5000 mg | INTRAMUSCULAR | Status: DC | PRN

## 2012-02-21 MED ORDER — DIPHENHYDRAMINE HCL 25 MG PO CAPS: 25.0000 mg | ORAL_CAPSULE | Freq: Four times a day (QID) | ORAL | Status: DC | PRN

## 2012-02-21 MED ORDER — BENZOCAINE-MENTHOL 20-0.5 % EX AERO: 1.0000 "application " | INHALATION_SPRAY | CUTANEOUS | Status: DC | PRN

## 2012-02-21 MED ORDER — TERBUTALINE SULFATE 1 MG/ML IJ SOLN: 0.2500 mg | Freq: Once | INTRAMUSCULAR | Status: DC | PRN

## 2012-02-21 NOTE — Anesthesia Preprocedure Evaluation (Signed)
Anesthesia Evaluation  Patient identified by MRN, date of birth, ID band Patient awake    Reviewed: Allergy & Precautions, H&P , Patient's Chart, lab work & pertinent test results  Airway Mallampati: III TM Distance: >3 FB Neck ROM: full    Dental No notable dental hx. (+) Teeth Intact   Pulmonary Current Smoker,  breath sounds clear to auscultation  Pulmonary exam normal       Cardiovascular negative cardio ROS  Rhythm:regular Rate:Normal     Neuro/Psych negative neurological ROS  negative psych ROS   GI/Hepatic Neg liver ROS, GERD-  Medicated and Controlled,  Endo/Other  negative endocrine ROS  Renal/GU negative Renal ROS  negative genitourinary   Musculoskeletal   Abdominal Normal abdominal exam  (+)   Peds  Hematology negative hematology ROS (+) anemia ,   Anesthesia Other Findings   Reproductive/Obstetrics (+) Pregnancy                           Anesthesia Physical Anesthesia Plan  ASA: II  Anesthesia Plan: Epidural   Post-op Pain Management:    Induction:   Airway Management Planned:   Additional Equipment:   Intra-op Plan:   Post-operative Plan:   Informed Consent: I have reviewed the patients History and Physical, chart, labs and discussed the procedure including the risks, benefits and alternatives for the proposed anesthesia with the patient or authorized representative who has indicated his/her understanding and acceptance.     Plan Discussed with: Anesthesiologist  Anesthesia Plan Comments:         Anesthesia Quick Evaluation

## 2012-02-21 NOTE — Progress Notes (Signed)
Heather Good is a 27 y.o. [phone] at [redacted]w[redacted]d by LMP admitted for rupture of membranes  Subjective:   Objective: BP 112/68  Pulse 60  Temp 98.1 F (36.7 C) (Oral)  Resp 18  Ht 5\' 6"  (1.676 m)  Wt 217 lb (98.431 kg)  BMI 35.02 kg/m2  SpO2 99%  LMP 06/06/2011   Total I/O In: -  Out: 250 [Urine:250]  FHT:  FHR: 150 bpm, variability: moderate,  accelerations:  Present,  decelerations:  Absent UC:   regular, every 3 minutes SVE:   Dilation: 10 Effacement (%): 100 Station: +1 Exam by:: Renaldo Harrison, RN  Labs: Lab Results  Component Value Date   WBC 9.8 02/20/2012   HGB 11.3* 02/20/2012   HCT 32.5* 02/20/2012   MCV 78.9 02/20/2012   PLT 179 02/20/2012    Assessment / Plan: Augmentation of labor, progressing well  Labor: Progressing normally Preeclampsia:  n/a Fetal Wellbeing:  Category I Pain Control:  Epidural I/D:  n/a Anticipated MOD:  NSVD  HARPER,CHARLES A 02/21/2012, 1:46 PM

## 2012-02-21 NOTE — Progress Notes (Signed)
Heather Good is Good 27 y.o. [phone] at [redacted]w[redacted]d by LMP admitted for rupture of membranes  Subjective:   Objective: BP 96/42  Pulse 61  Temp 97.5 F (36.4 C) (Oral)  Resp 18  Ht 5\' 6"  (1.676 m)  Wt 217 lb (98.431 kg)  BMI 35.02 kg/m2  SpO2 99%  LMP 06/06/2011      FHT:  FHR: 150 bpm, variability: moderate,  accelerations:  Present,  decelerations:  Absent UC:   irregular, every 5-7 minutes SVE:   Dilation: 7.5 Effacement (%): 80 Station: -2 Exam by:: Renaldo Harrison, RN  Labs: Lab Results  Component Value Date   WBC 9.8 02/20/2012   HGB 11.3* 02/20/2012   HCT 32.5* 02/20/2012   MCV 78.9 02/20/2012   PLT 179 02/20/2012    Assessment / Plan: Protracted active phase  Labor: Active phase.  Protracting.  Will start pitocin augmentin. Preeclampsia:  n/Good Fetal Wellbeing:  Category I Pain Control:  Epidural I/D:  n/Good Anticipated MOD:  NSVD  Heather Good 02/21/2012, 8:15 AM

## 2012-02-21 NOTE — Anesthesia Postprocedure Evaluation (Signed)
  Anesthesia Post-op Note  Patient: Heather Good  Procedure(s) Performed: * No procedures listed *  Patient Location: Mother/Baby  Anesthesia Type:Epidural  Level of Consciousness: awake, alert  and oriented  Airway and Oxygen Therapy: Patient Spontanous Breathing  Post-op Pain: none  Post-op Assessment: Post-op Vital signs reviewed, Patient's Cardiovascular Status Stable, Respiratory Function Stable, Patent Airway, No signs of Nausea or vomiting, Pain level controlled, No headache, No backache, No residual numbness and No residual motor weakness  Post-op Vital Signs: Reviewed and stable  Complications: No apparent anesthesia complications

## 2012-02-21 NOTE — Anesthesia Procedure Notes (Signed)
Epidural Patient location during procedure: OB Start time: 02/21/2012 3:27 AM  Staffing Anesthesiologist: Evo Aderman A. Performed by: anesthesiologist   Preanesthetic Checklist Completed: patient identified, site marked, surgical consent, pre-op evaluation, timeout performed, IV checked, risks and benefits discussed and monitors and equipment checked  Epidural Patient position: sitting Prep: site prepped and draped and DuraPrep Patient monitoring: continuous pulse ox and blood pressure Approach: midline Injection technique: LOR air  Needle:  Needle type: Tuohy  Needle gauge: 17 G Needle length: 9 cm and 9 Needle insertion depth: 7 cm Catheter type: closed end flexible Catheter size: 19 Gauge Catheter at skin depth: 12 cm Test dose: negative and Other  Assessment Events: blood not aspirated, injection not painful, no injection resistance, negative IV test and no paresthesia  Additional Notes Patient identified. Risks and benefits discussed including failed block, incomplete  Pain control, post dural puncture headache, nerve damage, paralysis, blood pressure Changes, nausea, vomiting, reactions to medications-both toxic and allergic and post Partum back pain. All questions were answered. Patient expressed understanding and wished to proceed. Sterile technique was used throughout procedure. Epidural site was Dressed with sterile barrier dressing. No paresthesias, signs of intravascular injection Or signs of intrathecal spread were encountered.  Patient was more comfortable after the epidural was dosed. Please see RN's note for documentation of vital signs and FHR which are stable.

## 2012-02-22 LAB — CBC
Hemoglobin: 9.6 g/dL — ABNORMAL LOW (ref 12.0–15.0)
MCH: 27 pg (ref 26.0–34.0)
MCV: 81.4 fL (ref 78.0–100.0)
RBC: 3.55 MIL/uL — ABNORMAL LOW (ref 3.87–5.11)

## 2012-02-22 NOTE — Progress Notes (Addendum)

## 2012-02-22 NOTE — Progress Notes (Signed)
Post Partum Day 1 Subjective: no complaints  Objective: Blood pressure 113/72, pulse 64, temperature 98.1 F (36.7 C), temperature source Oral, resp. rate 18, height 5\' 6"  (1.676 m), weight 217 lb (98.431 kg), last menstrual period 06/06/2011, SpO2 99.00%, unknown if currently breastfeeding.  Physical Exam:  General: alert and no distress Lochia: appropriate Uterine Fundus: firm Incision: none DVT Evaluation: No evidence of DVT seen on physical exam.   Basename 02/22/12 0540 02/20/12 2230  HGB 9.6* 11.3*  HCT 28.9* 32.5*    Assessment/Plan: Plan for discharge tomorrow   LOS: 2 days   HARPER,CHARLES A 02/22/2012, 9:21 AM

## 2012-02-23 MED ORDER — OXYCODONE-ACETAMINOPHEN 5-325 MG PO TABS: 2.0000 | ORAL_TABLET | Freq: Four times a day (QID) | ORAL | Status: DC | PRN

## 2012-02-23 NOTE — Discharge Summary (Signed)
  Obstetric Discharge Summary Reason for Admission: onset of labor Prenatal Procedures: none Intrapartum Procedures: spontaneous vaginal delivery Postpartum Procedures: none Complications-Operative and Postpartum: none  Hemoglobin  Date Value Range Status  02/22/2012 9.6* 12.0 - 15.0 g/dL Final     HCT  Date Value Range Status  02/22/2012 28.9* 36.0 - 46.0 % Final    Physical Exam:  General: alert Lochia: appropriate Uterine: firm Incision: n/a DVT Evaluation: No evidence of DVT seen on physical exam.  Discharge Diagnoses: Active Problems:  PROM (premature rupture of membranes)  Normal delivery   Discharge Information: Date: 02/23/2012 Activity: pelvic rest Diet: routine Medications:  Prior to Admission medications   Medication Sig Start Date End Date Taking? Authorizing Provider  Prenatal Vit-Fe Fumarate-FA (PRENATAL MULTIVITAMIN) TABS Take 1 tablet by mouth daily.   Yes Historical Provider, MD  oxyCODONE-acetaminophen (PERCOCET) 5-325 MG per tablet Take 2 tablets by mouth every 6 (six) hours as needed for pain. 02/23/12   Antionette Char, MD    Condition: stable Instructions: refer to routine discharge instructions Discharge to: home Follow-up Information    Follow up with HARPER,CHARLES A, MD. Schedule an appointment as soon as possible for a visit in 2 weeks. (call to schedule circumcision )    Contact information:   [address] [place] [address] [phone]          Newborn Data: Live born  Information for the patient's newborn:  Novalie, Leamy [[id number]]  female ; APGAR (1 MIN): 8   APGAR (5 MINS): 9     Home with mother.  JACKSON-MOORE,Vanesha [place] A 02/23/2012, 11:18 AM

## 2012-02-25 NOTE — Progress Notes (Signed)
Post discharge review completed. 

## 2012-09-05 ENCOUNTER — Encounter: Payer: Self-pay | Admitting: Obstetrics & Gynecology

## 2012-12-11 ENCOUNTER — Other Ambulatory Visit: Payer: Self-pay

## 2013-12-07 ENCOUNTER — Encounter (HOSPITAL_COMMUNITY): Payer: Self-pay

## 2014-07-08 ENCOUNTER — Emergency Department (HOSPITAL_COMMUNITY)
Admission: EM | Admit: 2014-07-08 | Discharge: 2014-07-08 | Disposition: A | Discharge status: Home / Self Care | Payer: Medicaid Other | Attending: Emergency Medicine | Admitting: Emergency Medicine

## 2014-07-08 ENCOUNTER — Encounter (HOSPITAL_COMMUNITY): Payer: Self-pay | Admitting: Emergency Medicine

## 2014-07-08 DIAGNOSIS — D649 Anemia, unspecified: Secondary | ICD-10-CM | POA: Insufficient documentation

## 2014-07-08 DIAGNOSIS — L03211 Cellulitis of face: Secondary | ICD-10-CM | POA: Insufficient documentation

## 2014-07-08 DIAGNOSIS — Z79899 Other long term (current) drug therapy: Secondary | ICD-10-CM | POA: Insufficient documentation

## 2014-07-08 DIAGNOSIS — Z720 Tobacco use: Secondary | ICD-10-CM | POA: Insufficient documentation

## 2014-07-08 MED ORDER — SULFAMETHOXAZOLE-TRIMETHOPRIM 800-160 MG PO TABS: 1.0000 | ORAL_TABLET | Freq: Two times a day (BID) | ORAL | Status: AC

## 2014-07-08 MED ORDER — HYDROCODONE-ACETAMINOPHEN 5-325 MG PO TABS: 2.0000 | ORAL_TABLET | ORAL | Status: DC | PRN
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
## 2014-07-08 NOTE — ED Notes (Signed)
Started yesterday with "pimple" on right side of face. Became more swollen and painful last pm. She applied hot compresses. Significant swelling to right face.

## 2014-07-08 NOTE — Discharge Instructions (Signed)

## 2015-02-04 ENCOUNTER — Encounter (HOSPITAL_COMMUNITY): Payer: Self-pay

## 2015-02-04 ENCOUNTER — Emergency Department (HOSPITAL_COMMUNITY)
Admission: EM | Admit: 2015-02-04 | Discharge: 2015-02-04 | Disposition: A | Discharge status: Home / Self Care | Payer: Medicaid Other | Attending: Emergency Medicine | Admitting: Emergency Medicine

## 2015-02-04 DIAGNOSIS — D649 Anemia, unspecified: Secondary | ICD-10-CM | POA: Insufficient documentation

## 2015-02-04 DIAGNOSIS — F17210 Nicotine dependence, cigarettes, uncomplicated: Secondary | ICD-10-CM | POA: Insufficient documentation

## 2015-02-04 DIAGNOSIS — Z79899 Other long term (current) drug therapy: Secondary | ICD-10-CM | POA: Insufficient documentation

## 2015-02-04 DIAGNOSIS — L02511 Cutaneous abscess of right hand: Secondary | ICD-10-CM | POA: Insufficient documentation

## 2015-02-04 MED ORDER — DOXYCYCLINE HYCLATE 100 MG PO CAPS: 100.0000 mg | ORAL_CAPSULE | Freq: Two times a day (BID) | ORAL | Status: DC
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
## 2015-02-04 NOTE — ED Notes (Signed)
Please see provider not for assessment.

## 2015-02-04 NOTE — Discharge Instructions (Signed)
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
## 2015-02-06 NOTE — L&D Delivery Note (Signed)
Delivery Note At 3:10 PM a viable female was delivered via  (Presentation:vertex ; LOA ).  APGAR:8 , 9; weight  .   Placenta status:spont ,via shultz .  Cord:3vc  with the following complications:none .  Cord pH: n/a  Anesthesia:  nitrous Episiotomy:  none Lacerations:  none Suture Repair: none Est. Blood Loss 150 (mL):    Mom to postpartum.  Baby to Couplet care / Skin to Skin.  Wyvonnia DuskyMarie Zoltan Genest 07/10/2015, 3:18 PM

## 2015-04-05 ENCOUNTER — Inpatient Hospital Stay (HOSPITAL_COMMUNITY)
Admission: AD | Admit: 2015-04-05 | Discharge: 2015-04-05 | Disposition: A | Discharge status: Home / Self Care | Payer: Self-pay | Source: Ambulatory Visit | Attending: Obstetrics & Gynecology | Admitting: Obstetrics & Gynecology

## 2015-04-05 ENCOUNTER — Encounter (HOSPITAL_COMMUNITY): Payer: Self-pay | Admitting: *Deleted

## 2015-04-05 DIAGNOSIS — O0932 Supervision of pregnancy with insufficient antenatal care, second trimester: Secondary | ICD-10-CM | POA: Insufficient documentation

## 2015-04-05 DIAGNOSIS — J45909 Unspecified asthma, uncomplicated: Secondary | ICD-10-CM | POA: Insufficient documentation

## 2015-04-05 DIAGNOSIS — O26892 Other specified pregnancy related conditions, second trimester: Secondary | ICD-10-CM | POA: Insufficient documentation

## 2015-04-05 DIAGNOSIS — Z3201 Encounter for pregnancy test, result positive: Secondary | ICD-10-CM

## 2015-04-05 DIAGNOSIS — O9989 Other specified diseases and conditions complicating pregnancy, childbirth and the puerperium: Secondary | ICD-10-CM

## 2015-04-05 DIAGNOSIS — O99332 Smoking (tobacco) complicating pregnancy, second trimester: Secondary | ICD-10-CM | POA: Insufficient documentation

## 2015-04-05 DIAGNOSIS — Z3A22 22 weeks gestation of pregnancy: Secondary | ICD-10-CM | POA: Insufficient documentation

## 2015-04-05 DIAGNOSIS — R109 Unspecified abdominal pain: Secondary | ICD-10-CM | POA: Insufficient documentation

## 2015-04-05 HISTORY — DX: Headache: R51

## 2015-04-05 HISTORY — DX: Headache, unspecified: R51.9

## 2015-04-05 LAB — TYPE AND SCREEN
ABO/RH(D): O POS
ANTIBODY SCREEN: NEGATIVE

## 2015-04-05 LAB — URINE MICROSCOPIC-ADD ON: RBC / HPF: NONE SEEN RBC/hpf (ref 0–5)

## 2015-04-05 LAB — CBC
HCT: 34.6 % — ABNORMAL LOW (ref 36.0–46.0)
HEMOGLOBIN: 11.8 g/dL — AB (ref 12.0–15.0)
MCH: 28.8 pg (ref 26.0–34.0)
MCHC: 34.1 g/dL (ref 30.0–36.0)
MCV: 84.4 fL (ref 78.0–100.0)
Platelets: 187 10*3/uL (ref 150–400)
RBC: 4.1 MIL/uL (ref 3.87–5.11)
RDW: 14.5 % (ref 11.5–15.5)
WBC: 10.8 10*3/uL — ABNORMAL HIGH (ref 4.0–10.5)

## 2015-04-05 LAB — RAPID HIV SCREEN (HIV 1/2 AB+AG)
HIV 1/2 ANTIBODIES: NONREACTIVE
HIV-1 P24 ANTIGEN - HIV24: NONREACTIVE

## 2015-04-05 LAB — HEPATITIS B SURFACE ANTIGEN: Hepatitis B Surface Ag: NEGATIVE

## 2015-04-05 LAB — URINALYSIS, ROUTINE W REFLEX MICROSCOPIC
Bilirubin Urine: NEGATIVE
GLUCOSE, UA: NEGATIVE mg/dL
HGB URINE DIPSTICK: NEGATIVE
KETONES UR: NEGATIVE mg/dL
Nitrite: NEGATIVE
PROTEIN: 30 mg/dL — AB
Specific Gravity, Urine: 1.02 (ref 1.005–1.030)
pH: 7.5 (ref 5.0–8.0)

## 2015-04-05 LAB — DIFFERENTIAL
Basophils Absolute: 0 10*3/uL (ref 0.0–0.1)
Basophils Relative: 0 %
EOS PCT: 1 %
Eosinophils Absolute: 0.1 10*3/uL (ref 0.0–0.7)
LYMPHS ABS: 2.4 10*3/uL (ref 0.7–4.0)
LYMPHS PCT: 22 %
Monocytes Absolute: 0.3 10*3/uL (ref 0.1–1.0)
Monocytes Relative: 3 %
NEUTROS ABS: 8 10*3/uL — AB (ref 1.7–7.7)
NEUTROS PCT: 74 %

## 2015-04-05 NOTE — Discharge Instructions (Signed)
You will need an ultrasound for your baby. Someone will call you to schedule the ultrasound. Additionally, you will need to see a doctor while you are pregnant. Please call [facility] within the next week to make an appointment for prenatal care.  Return to MAU as needed.  Come to the MAU (maternity admission unit) for 1) Strong contractions every 2-3 minutes for at least 1 hour that do no go away when you drink water or take a warm shower. These contractions will be so strong all you can do is breath through them 2) Vaginal bleeding- anything more than spotting 3) Loss of fluid like you broke your water 4) Decreased movement of your baby
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
## 2015-04-05 NOTE — MAU Note (Addendum)
Pt states she needs proof of pregnancy for insurance.  Pt C/O lower abd pain for the past week, denies bleeding or LOF. No PNC.  Pt states she went to abortion clinic on Feb 4 & was told she was 24 weeks @ that time.

## 2015-04-05 NOTE — Progress Notes (Signed)
Following lab draw, pt states that she cannot stay any longer to wait for discharge.  Pt instructed on importance of staying for discharge, pregnancy verification letter, and instructions for obtaining prenatal care at [facility].  Pt refused to to stay and left before receiving discharge papers, letter, and instructions.  Provider notified.

## 2015-04-06 LAB — RUBELLA SCREEN: RUBELLA: 1.3 {index} (ref >0.99)

## 2015-04-06 LAB — HIV ANTIBODY (ROUTINE TESTING W REFLEX): HIV SCREEN 4TH GENERATION: NONREACTIVE

## 2015-04-06 LAB — CULTURE, OB URINE: CULTURE: NO GROWTH

## 2015-04-06 LAB — GC/CHLAMYDIA PROBE AMP (~~LOC~~) NOT AT ARMC
Chlamydia: NEGATIVE
Neisseria Gonorrhea: NEGATIVE

## 2015-04-06 LAB — RPR: RPR Ser Ql: NONREACTIVE

## 2015-04-20 ENCOUNTER — Inpatient Hospital Stay (HOSPITAL_COMMUNITY)
Admission: AD | Admit: 2015-04-20 | Discharge: 2015-04-20 | Disposition: A | Discharge status: Home / Self Care | Payer: Medicaid Other | Source: Ambulatory Visit | Attending: Obstetrics and Gynecology | Admitting: Obstetrics and Gynecology

## 2015-04-20 ENCOUNTER — Encounter (HOSPITAL_COMMUNITY): Payer: Self-pay | Admitting: *Deleted

## 2015-04-20 DIAGNOSIS — N898 Other specified noninflammatory disorders of vagina: Secondary | ICD-10-CM

## 2015-04-20 DIAGNOSIS — Z3A24 24 weeks gestation of pregnancy: Secondary | ICD-10-CM | POA: Insufficient documentation

## 2015-04-20 DIAGNOSIS — N760 Acute vaginitis: Secondary | ICD-10-CM | POA: Diagnosis not present

## 2015-04-20 DIAGNOSIS — O99332 Smoking (tobacco) complicating pregnancy, second trimester: Secondary | ICD-10-CM | POA: Diagnosis not present

## 2015-04-20 DIAGNOSIS — O23592 Infection of other part of genital tract in pregnancy, second trimester: Secondary | ICD-10-CM | POA: Insufficient documentation

## 2015-04-20 DIAGNOSIS — O26899 Other specified pregnancy related conditions, unspecified trimester: Secondary | ICD-10-CM

## 2015-04-20 DIAGNOSIS — F17210 Nicotine dependence, cigarettes, uncomplicated: Secondary | ICD-10-CM | POA: Diagnosis not present

## 2015-04-20 DIAGNOSIS — A499 Bacterial infection, unspecified: Secondary | ICD-10-CM

## 2015-04-20 DIAGNOSIS — O23591 Infection of other part of genital tract in pregnancy, first trimester: Secondary | ICD-10-CM

## 2015-04-20 DIAGNOSIS — B9689 Other specified bacterial agents as the cause of diseases classified elsewhere: Secondary | ICD-10-CM | POA: Diagnosis not present

## 2015-04-20 DIAGNOSIS — R109 Unspecified abdominal pain: Secondary | ICD-10-CM | POA: Insufficient documentation

## 2015-04-20 LAB — URINALYSIS, ROUTINE W REFLEX MICROSCOPIC
Bilirubin Urine: NEGATIVE
Glucose, UA: NEGATIVE mg/dL
Hgb urine dipstick: NEGATIVE
KETONES UR: NEGATIVE mg/dL
NITRITE: NEGATIVE
PROTEIN: 100 mg/dL — AB
Specific Gravity, Urine: 1.015 (ref 1.005–1.030)
pH: 7.5 (ref 5.0–8.0)

## 2015-04-20 LAB — WET PREP, GENITAL
SPERM: NONE SEEN
TRICH WET PREP: NONE SEEN
Yeast Wet Prep HPF POC: NONE SEEN

## 2015-04-20 LAB — URINE MICROSCOPIC-ADD ON

## 2015-04-20 LAB — POCT FERN TEST: POCT FERN TEST: NEGATIVE

## 2015-04-20 MED ORDER — METRONIDAZOLE 500 MG PO TABS: 500.0000 mg | ORAL_TABLET | Freq: Two times a day (BID) | ORAL | Status: DC

## 2015-04-20 NOTE — MAU Note (Signed)
Urine sent to lab 

## 2015-04-20 NOTE — MAU Note (Signed)
For the last 2 days has been waking up wet, not as much during the day.   Having pain in LLQ for past 2 wks.
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
## 2015-04-20 NOTE — Discharge Instructions (Signed)

## 2015-04-21 LAB — GC/CHLAMYDIA PROBE AMP (~~LOC~~) NOT AT ARMC
CHLAMYDIA, DNA PROBE: NEGATIVE
NEISSERIA GONORRHEA: NEGATIVE

## 2015-05-02 ENCOUNTER — Ambulatory Visit (INDEPENDENT_AMBULATORY_CARE_PROVIDER_SITE_OTHER): Payer: Medicaid Other | Admitting: Obstetrics and Gynecology

## 2015-05-02 ENCOUNTER — Encounter: Payer: Self-pay | Admitting: Obstetrics and Gynecology

## 2015-05-02 VITALS — BP 123/82 | HR 79 | Temp 98.5°F | Wt 198.4 lb

## 2015-05-02 DIAGNOSIS — Z113 Encounter for screening for infections with a predominantly sexual mode of transmission: Secondary | ICD-10-CM

## 2015-05-02 DIAGNOSIS — Z3482 Encounter for supervision of other normal pregnancy, second trimester: Secondary | ICD-10-CM

## 2015-05-02 DIAGNOSIS — F17210 Nicotine dependence, cigarettes, uncomplicated: Secondary | ICD-10-CM

## 2015-05-02 DIAGNOSIS — Z23 Encounter for immunization: Secondary | ICD-10-CM

## 2015-05-02 DIAGNOSIS — O0932 Supervision of pregnancy with insufficient antenatal care, second trimester: Secondary | ICD-10-CM

## 2015-05-02 DIAGNOSIS — O0942 Supervision of pregnancy with grand multiparity, second trimester: Secondary | ICD-10-CM

## 2015-05-02 DIAGNOSIS — O0940 Supervision of pregnancy with grand multiparity, unspecified trimester: Secondary | ICD-10-CM | POA: Insufficient documentation

## 2015-05-02 DIAGNOSIS — O99332 Smoking (tobacco) complicating pregnancy, second trimester: Secondary | ICD-10-CM

## 2015-05-02 DIAGNOSIS — Z3480 Encounter for supervision of other normal pregnancy, unspecified trimester: Secondary | ICD-10-CM | POA: Insufficient documentation

## 2015-05-02 DIAGNOSIS — O99333 Smoking (tobacco) complicating pregnancy, third trimester: Secondary | ICD-10-CM | POA: Insufficient documentation

## 2015-05-02 DIAGNOSIS — O0930 Supervision of pregnancy with insufficient antenatal care, unspecified trimester: Secondary | ICD-10-CM | POA: Insufficient documentation

## 2015-05-02 LAB — POCT URINALYSIS DIP (DEVICE)
Bilirubin Urine: NEGATIVE
Glucose, UA: NEGATIVE mg/dL
Hgb urine dipstick: NEGATIVE
KETONES UR: NEGATIVE mg/dL
Leukocytes, UA: NEGATIVE
Nitrite: NEGATIVE
PH: 7.5 (ref 5.0–8.0)
PROTEIN: 30 mg/dL — AB
SPECIFIC GRAVITY, URINE: 1.02 (ref 1.005–1.030)
Urobilinogen, UA: 1 mg/dL (ref 0.0–1.0)

## 2015-05-02 LAB — GLUCOSE TOLERANCE, 1 HOUR (50G) W/O FASTING: GLUCOSE, 1 HR, GESTATIONAL: 60 mg/dL — AB (ref <140)

## 2015-05-02 MED ORDER — TETANUS-DIPHTH-ACELL PERTUSSIS 5-2.5-18.5 LF-MCG/0.5 IM SUSP: 0.5000 mL | Freq: Once | INTRAMUSCULAR | Status: DC

## 2015-05-02 NOTE — Patient Instructions (Addendum)
Second Trimester of Pregnancy The second trimester is from week 13 through week 28, months 4 through 6. The second trimester is often a time when you feel your best. Your body has also adjusted to being pregnant, and you begin to feel better physically. Usually, morning sickness has lessened or quit completely, you may have more energy, and you may have an increase in appetite. The second trimester is also a time when the fetus is growing rapidly. At the end of the sixth month, the fetus is about 9 inches long and weighs about 1 pounds. You will likely begin to feel the baby move (quickening) between 18 and 20 weeks of the pregnancy. BODY CHANGES Your body goes through many changes during pregnancy. The changes vary from woman to woman.   Your weight will continue to increase. You will notice your lower abdomen bulging out.  You may begin to get stretch marks on your hips, abdomen, and breasts.  You may develop headaches that can be relieved by medicines approved by your health care provider.  You may urinate more often because the fetus is pressing on your bladder.  You may develop or continue to have heartburn as a result of your pregnancy.  You may develop constipation because certain hormones are causing the muscles that push waste through your intestines to slow down.  You may develop hemorrhoids or swollen, bulging veins (varicose veins).  You may have back pain because of the weight gain and pregnancy hormones relaxing your joints between the bones in your pelvis and as a result of a shift in weight and the muscles that support your balance.  Your breasts will continue to grow and be tender.  Your gums may bleed and may be sensitive to brushing and flossing.  Dark spots or blotches (chloasma, mask of pregnancy) may develop on your face. This will likely fade after the baby is born.  A dark line from your belly button to the pubic area (linea nigra) may appear. This will likely  fade after the baby is born.  You may have changes in your hair. These can include thickening of your hair, rapid growth, and changes in texture. Some women also have hair loss during or after pregnancy, or hair that feels dry or thin. Your hair will most likely return to normal after your baby is born. WHAT TO EXPECT AT YOUR PRENATAL VISITS During a routine prenatal visit:  You will be weighed to make sure you and the fetus are growing normally.  Your blood pressure will be taken.  Your abdomen will be measured to track your baby's growth.  The fetal heartbeat will be listened to.  Any test results from the previous visit will be discussed. Your health care provider may ask you:  How you are feeling.  If you are feeling the baby move.  If you have had any abnormal symptoms, such as leaking fluid, bleeding, severe headaches, or abdominal cramping.  If you are using any tobacco products, including cigarettes, chewing tobacco, and electronic cigarettes.  If you have any questions. Other tests that may be performed during your second trimester include:  Blood tests that check for:  Low iron levels (anemia).  Gestational diabetes (between 24 and 28 weeks).  Rh antibodies.  Urine tests to check for infections, diabetes, or protein in the urine.  An ultrasound to confirm the proper growth and development of the baby.  An amniocentesis to check for possible genetic problems.  Fetal screens for spina bifida   and Down syndrome.  HIV (human immunodeficiency virus) testing. Routine prenatal testing includes screening for HIV, unless you choose not to have this test. HOME CARE INSTRUCTIONS   Avoid all smoking, herbs, alcohol, and unprescribed drugs. These chemicals affect the formation and growth of the baby.  Do not use any tobacco products, including cigarettes, chewing tobacco, and electronic cigarettes. If you need help quitting, ask your health care provider. You may receive  counseling support and other resources to help you quit.  Follow your health care provider's instructions regarding medicine use. There are medicines that are either safe or unsafe to take during pregnancy.  Exercise only as directed by your health care provider. Experiencing uterine cramps is a good sign to stop exercising.  Continue to eat regular, healthy meals.  Wear a good support bra for breast tenderness.  Do not use hot tubs, steam rooms, or saunas.  Wear your seat belt at all times when driving.  Avoid raw meat, uncooked cheese, cat litter boxes, and soil used by cats. These carry germs that can cause birth defects in the baby.  Take your prenatal vitamins.  Take 1500-2000 mg of calcium daily starting at the 20th week of pregnancy until you deliver your baby.  Try taking a stool softener (if your health care provider approves) if you develop constipation. Eat more high-fiber foods, such as fresh vegetables or fruit and whole grains. Drink plenty of fluids to keep your urine clear or pale yellow.  Take warm sitz baths to soothe any pain or discomfort caused by hemorrhoids. Use hemorrhoid cream if your health care provider approves.  If you develop varicose veins, wear support hose. Elevate your feet for 15 minutes, 3-4 times a day. Limit salt in your diet.  Avoid heavy lifting, wear low heel shoes, and practice good posture.  Rest with your legs elevated if you have leg cramps or low back pain.  Visit your dentist if you have not gone yet during your pregnancy. Use a soft toothbrush to brush your teeth and be gentle when you floss.  A sexual relationship may be continued unless your health care provider directs you otherwise.  Continue to go to all your prenatal visits as directed by your health care provider. SEEK MEDICAL CARE IF:   You have dizziness.  You have mild pelvic cramps, pelvic pressure, or nagging pain in the abdominal area.  You have persistent nausea,  vomiting, or diarrhea.  You have a bad smelling vaginal discharge.  You have pain with urination. SEEK IMMEDIATE MEDICAL CARE IF:   You have a fever.  You are leaking fluid from your vagina.  You have spotting or bleeding from your vagina.  You have severe abdominal cramping or pain.  You have rapid weight gain or loss.  You have shortness of breath with chest pain.  You notice sudden or extreme swelling of your face, hands, ankles, feet, or legs.  You have not felt your baby move in over an hour.  You have severe headaches that do not go away with medicine.  You have vision changes.   This information is not intended to replace advice given to you by your health care provider. Make sure you discuss any questions you have with your health care provider.   Document Released: 01/16/2001 Document Revised: 02/12/2014 Document Reviewed: 03/25/2012 Elsevier Interactive Patient Education 2016 [employer]  Contraception Choices Contraception (birth control) is the use of any methods or devices to prevent pregnancy. Below are some methods to   help avoid pregnancy. HORMONAL METHODS   Contraceptive implant. This is a thin, plastic tube containing progesterone hormone. It does not contain estrogen hormone. Your health care provider inserts the tube in the inner part of the upper arm. The tube can remain in place for up to 3 years. After 3 years, the implant must be removed. The implant prevents the ovaries from releasing an egg (ovulation), thickens the cervical mucus to prevent sperm from entering the uterus, and thins the lining of the inside of the uterus.  Progesterone-only injections. These injections are given every 3 months by your health care provider to prevent pregnancy. This synthetic progesterone hormone stops the ovaries from releasing eggs. It also thickens cervical mucus and changes the uterine lining. This makes it harder for sperm to survive in the uterus.  Birth  control pills. These pills contain estrogen and progesterone hormone. They work by preventing the ovaries from releasing eggs (ovulation). They also cause the cervical mucus to thicken, preventing the sperm from entering the uterus. Birth control pills are prescribed by a health care provider.Birth control pills can also be used to treat heavy periods.  Minipill. This type of birth control pill contains only the progesterone hormone. They are taken every day of each month and must be prescribed by your health care provider.  Birth control patch. The patch contains hormones similar to those in birth control pills. It must be changed once a week and is prescribed by a health care provider.  Vaginal ring. The ring contains hormones similar to those in birth control pills. It is left in the vagina for 3 weeks, removed for 1 week, and then a new one is put back in place. The patient must be comfortable inserting and removing the ring from the vagina.A health care provider's prescription is necessary.  Emergency contraception. Emergency contraceptives prevent pregnancy after unprotected sexual intercourse. This pill can be taken right after sex or up to 5 days after unprotected sex. It is most effective the sooner you take the pills after having sexual intercourse. Most emergency contraceptive pills are available without a prescription. Check with your pharmacist. Do not use emergency contraception as your only form of birth control. BARRIER METHODS   Female condom. This is a thin sheath (latex or rubber) that is worn over the penis during sexual intercourse. It can be used with spermicide to increase effectiveness.  Female condom. This is a soft, loose-fitting sheath that is put into the vagina before sexual intercourse.  Diaphragm. This is a soft, latex, dome-shaped barrier that must be fitted by a health care provider. It is inserted into the vagina, along with a spermicidal jelly. It is inserted before  intercourse. The diaphragm should be left in the vagina for 6 to 8 hours after intercourse.  Cervical cap. This is a round, soft, latex or plastic cup that fits over the cervix and must be fitted by a health care provider. The cap can be left in place for up to 48 hours after intercourse.  Sponge. This is a soft, circular piece of polyurethane foam. The sponge has spermicide in it. It is inserted into the vagina after wetting it and before sexual intercourse.  Spermicides. These are chemicals that kill or block sperm from entering the cervix and uterus. They come in the form of creams, jellies, suppositories, foam, or tablets. They do not require a prescription. They are inserted into the vagina with an applicator before having sexual intercourse. The process must be repeated every   time you have sexual intercourse. INTRAUTERINE CONTRACEPTION  Intrauterine device (IUD). This is a T-shaped device that is put in a woman's uterus during a menstrual period to prevent pregnancy. There are 2 types:  Copper IUD. This type of IUD is wrapped in copper wire and is placed inside the uterus. Copper makes the uterus and fallopian tubes produce a fluid that kills sperm. It can stay in place for 10 years.  Hormone IUD. This type of IUD contains the hormone progestin (synthetic progesterone). The hormone thickens the cervical mucus and prevents sperm from entering the uterus, and it also thins the uterine lining to prevent implantation of a fertilized egg. The hormone can weaken or kill the sperm that get into the uterus. It can stay in place for 3-5 years, depending on which type of IUD is used. PERMANENT METHODS OF CONTRACEPTION  Female tubal ligation. This is when the woman's fallopian tubes are surgically sealed, tied, or blocked to prevent the egg from traveling to the uterus.  Hysteroscopic sterilization. This involves placing a small coil or insert into each fallopian tube. Your doctor uses a technique  called hysteroscopy to do the procedure. The device causes scar tissue to form. This results in permanent blockage of the fallopian tubes, so the sperm cannot fertilize the egg. It takes about 3 months after the procedure for the tubes to become blocked. You must use another form of birth control for these 3 months.  Female sterilization. This is when the female has the tubes that carry sperm tied off (vasectomy).This blocks sperm from entering the vagina during sexual intercourse. After the procedure, the man can still ejaculate fluid (semen). NATURAL PLANNING METHODS  Natural family planning. This is not having sexual intercourse or using a barrier method (condom, diaphragm, cervical cap) on days the woman could become pregnant.  Calendar method. This is keeping track of the length of each menstrual cycle and identifying when you are fertile.  Ovulation method. This is avoiding sexual intercourse during ovulation.  Symptothermal method. This is avoiding sexual intercourse during ovulation, using a thermometer and ovulation symptoms.  Post-ovulation method. This is timing sexual intercourse after you have ovulated. Regardless of which type or method of contraception you choose, it is important that you use condoms to protect against the transmission of sexually transmitted infections (STIs). Talk with your health care provider about which form of contraception is most appropriate for you.   This information is not intended to replace advice given to you by your health care provider. Make sure you discuss any questions you have with your health care provider.   Document Released: 01/22/2005 Document Revised: 01/27/2013 Document Reviewed: 07/17/2012 Elsevier Interactive Patient Education 2016 [employer]  Breastfeeding Deciding to breastfeed is one of the best choices you can make for you and your baby. A change in hormones during pregnancy causes your breast tissue to grow and increases the  number and size of your milk ducts. These hormones also allow proteins, sugars, and fats from your blood supply to make breast milk in your milk-producing glands. Hormones prevent breast milk from being released before your baby is born as well as prompt milk flow after birth. Once breastfeeding has begun, thoughts of your baby, as well as his or her sucking or crying, can stimulate the release of milk from your milk-producing glands.  BENEFITS OF BREASTFEEDING For Your Baby  Your first milk (colostrum) helps your baby's digestive system function better.  There are antibodies in your milk that help   your baby fight off infections.  Your baby has a lower incidence of asthma, allergies, and sudden infant death syndrome.  The nutrients in breast milk are better for your baby than infant formulas and are designed uniquely for your baby's needs.  Breast milk improves your baby's brain development.  Your baby is less likely to develop other conditions, such as childhood obesity, asthma, or type 2 diabetes mellitus. For You  Breastfeeding helps to create a very special bond between you and your baby.  Breastfeeding is convenient. Breast milk is always available at the correct temperature and costs nothing.  Breastfeeding helps to burn calories and helps you lose the weight gained during pregnancy.  Breastfeeding makes your uterus contract to its prepregnancy size faster and slows bleeding (lochia) after you give birth.   Breastfeeding helps to lower your risk of developing type 2 diabetes mellitus, osteoporosis, and breast or ovarian cancer later in life. SIGNS THAT YOUR BABY IS HUNGRY Early Signs of Hunger  Increased alertness or activity.  Stretching.  Movement of the head from side to side.  Movement of the head and opening of the mouth when the corner of the mouth or cheek is stroked (rooting).  Increased sucking sounds, smacking lips, cooing, sighing, or squeaking.  Hand-to-mouth  movements.  Increased sucking of fingers or hands. Late Signs of Hunger  Fussing.  Intermittent crying. Extreme Signs of Hunger Signs of extreme hunger will require calming and consoling before your baby will be able to breastfeed successfully. Do not wait for the following signs of extreme hunger to occur before you initiate breastfeeding:  Restlessness.  A loud, strong cry.  Screaming. BREASTFEEDING BASICS Breastfeeding Initiation  Find a comfortable place to sit or lie down, with your neck and back well supported.  Place a pillow or rolled up blanket under your baby to bring him or her to the level of your breast (if you are seated). Nursing pillows are specially designed to help support your arms and your baby while you breastfeed.  Make sure that your baby's abdomen is facing your abdomen.  Gently massage your breast. With your fingertips, massage from your chest wall toward your nipple in a circular motion. This encourages milk flow. You may need to continue this action during the feeding if your milk flows slowly.  Support your breast with 4 fingers underneath and your thumb above your nipple. Make sure your fingers are well away from your nipple and your baby's mouth.  Stroke your baby's lips gently with your finger or nipple.  When your baby's mouth is open wide enough, quickly bring your baby to your breast, placing your entire nipple and as much of the colored area around your nipple (areola) as possible into your baby's mouth.  More areola should be visible above your baby's upper lip than below the lower lip.  Your baby's tongue should be between his or her lower gum and your breast.  Ensure that your baby's mouth is correctly positioned around your nipple (latched). Your baby's lips should create a seal on your breast and be turned out (everted).  It is common for your baby to suck about 2-3 minutes in order to start the flow of breast milk. Latching Teaching  your baby how to latch on to your breast properly is very important. An improper latch can cause nipple pain and decreased milk supply for you and poor weight gain in your baby. Also, if your baby is not latched onto your nipple properly, he   or she may swallow some air during feeding. This can make your baby fussy. Burping your baby when you switch breasts during the feeding can help to get rid of the air. However, teaching your baby to latch on properly is still the best way to prevent fussiness from swallowing air while breastfeeding. Signs that your baby has successfully latched on to your nipple:  Silent tugging or silent sucking, without causing you pain.  Swallowing heard between every 3-4 sucks.  Muscle movement above and in front of his or her ears while sucking. Signs that your baby has not successfully latched on to nipple:  Sucking sounds or smacking sounds from your baby while breastfeeding.  Nipple pain. If you think your baby has not latched on correctly, slip your finger into the corner of your baby's mouth to break the suction and place it between your baby's gums. Attempt breastfeeding initiation again. Signs of Successful Breastfeeding Signs from your baby:  A gradual decrease in the number of sucks or complete cessation of sucking.  Falling asleep.  Relaxation of his or her body.  Retention of a small amount of milk in his or her mouth.  Letting go of your breast by himself or herself. Signs from you:  Breasts that have increased in firmness, weight, and size 1-3 hours after feeding.  Breasts that are softer immediately after breastfeeding.  Increased milk volume, as well as a change in milk consistency and color by the fifth day of breastfeeding.  Nipples that are not sore, cracked, or bleeding. Signs That Your Baby is Getting Enough Milk  Wetting at least 3 diapers in a 24-hour period. The urine should be clear and pale yellow by age 5 days.  At least 3  stools in a 24-hour period by age 5 days. The stool should be soft and yellow.  At least 3 stools in a 24-hour period by age 7 days. The stool should be seedy and yellow.  No loss of weight greater than 10% of birth weight during the first 3 days of age.  Average weight gain of 4-7 ounces (113-198 g) per week after age 4 days.  Consistent daily weight gain by age 5 days, without weight loss after the age of 2 weeks. After a feeding, your baby may spit up a small amount. This is common. BREASTFEEDING FREQUENCY AND DURATION Frequent feeding will help you make more milk and can prevent sore nipples and breast engorgement. Breastfeed when you feel the need to reduce the fullness of your breasts or when your baby shows signs of hunger. This is called "breastfeeding on demand." Avoid introducing a pacifier to your baby while you are working to establish breastfeeding (the first 4-6 weeks after your baby is born). After this time you may choose to use a pacifier. Research has shown that pacifier use during the first year of a baby's life decreases the risk of sudden infant death syndrome (SIDS). Allow your baby to feed on each breast as long as he or she wants. Breastfeed until your baby is finished feeding. When your baby unlatches or falls asleep while feeding from the first breast, offer the second breast. Because newborns are often sleepy in the first few weeks of life, you may need to awaken your baby to get him or her to feed. Breastfeeding times will vary from baby to baby. However, the following rules can serve as a guide to help you ensure that your baby is properly fed:  Newborns (babies 4 weeks   of age or younger) may breastfeed every 1-3 hours.  Newborns should not go longer than 3 hours during the day or 5 hours during the night without breastfeeding.  You should breastfeed your baby a minimum of 8 times in a 24-hour period until you begin to introduce solid foods to your baby at around 6  months of age. BREAST MILK PUMPING Pumping and storing breast milk allows you to ensure that your baby is exclusively fed your breast milk, even at times when you are unable to breastfeed. This is especially important if you are going back to work while you are still breastfeeding or when you are not able to be present during feedings. Your lactation consultant can give you guidelines on how long it is safe to store breast milk. A breast pump is a machine that allows you to pump milk from your breast into a sterile bottle. The pumped breast milk can then be stored in a refrigerator or freezer. Some breast pumps are operated by hand, while others use electricity. Ask your lactation consultant which type will work best for you. Breast pumps can be purchased, but some hospitals and breastfeeding support groups lease breast pumps on a monthly basis. A lactation consultant can teach you how to hand express breast milk, if you prefer not to use a pump. CARING FOR YOUR BREASTS WHILE YOU BREASTFEED Nipples can become dry, cracked, and sore while breastfeeding. The following recommendations can help keep your breasts moisturized and healthy:  Avoid using soap on your nipples.  Wear a supportive bra. Although not required, special nursing bras and tank tops are designed to allow access to your breasts for breastfeeding without taking off your entire bra or top. Avoid wearing underwire-style bras or extremely tight bras.  Air dry your nipples for 3-4minutes after each feeding.  Use only cotton bra pads to absorb leaked breast milk. Leaking of breast milk between feedings is normal.  Use lanolin on your nipples after breastfeeding. Lanolin helps to maintain your skin's normal moisture barrier. If you use pure lanolin, you do not need to wash it off before feeding your baby again. Pure lanolin is not toxic to your baby. You may also hand express a few drops of breast milk and gently massage that milk into your  nipples and allow the milk to air dry. In the first few weeks after giving birth, some women experience extremely full breasts (engorgement). Engorgement can make your breasts feel heavy, warm, and tender to the touch. Engorgement peaks within 3-5 days after you give birth. The following recommendations can help ease engorgement:  Completely empty your breasts while breastfeeding or pumping. You may want to start by applying warm, moist heat (in the shower or with warm water-soaked hand towels) just before feeding or pumping. This increases circulation and helps the milk flow. If your baby does not completely empty your breasts while breastfeeding, pump any extra milk after he or she is finished.  Wear a snug bra (nursing or regular) or tank top for 1-2 days to signal your body to slightly decrease milk production.  Apply ice packs to your breasts, unless this is too uncomfortable for you.  Make sure that your baby is latched on and positioned properly while breastfeeding. If engorgement persists after 48 hours of following these recommendations, contact your health care provider or a lactation consultant. OVERALL HEALTH CARE RECOMMENDATIONS WHILE BREASTFEEDING  Eat healthy foods. Alternate between meals and snacks, eating 3 of each per day. Because what   you eat affects your breast milk, some of the foods may make your baby more irritable than usual. Avoid eating these foods if you are sure that they are negatively affecting your baby.  Drink milk, fruit juice, and water to satisfy your thirst (about 10 glasses a day).  Rest often, relax, and continue to take your prenatal vitamins to prevent fatigue, stress, and anemia.  Continue breast self-awareness checks.  Avoid chewing and smoking tobacco. Chemicals from cigarettes that pass into breast milk and exposure to secondhand smoke may harm your baby.  Avoid alcohol and drug use, including marijuana. Some medicines that may be harmful to your  baby can pass through breast milk. It is important to ask your health care provider before taking any medicine, including all over-the-counter and prescription medicine as well as vitamin and herbal supplements. It is possible to become pregnant while breastfeeding. If birth control is desired, ask your health care provider about options that will be safe for your baby. SEEK MEDICAL CARE IF:  You feel like you want to stop breastfeeding or have become frustrated with breastfeeding.  You have painful breasts or nipples.  Your nipples are cracked or bleeding.  Your breasts are red, tender, or warm.  You have a swollen area on either breast.  You have a fever or chills.  You have nausea or vomiting.  You have drainage other than breast milk from your nipples.  Your breasts do not become full before feedings by the fifth day after you give birth.  You feel sad and depressed.  Your baby is too sleepy to eat well.  Your baby is having trouble sleeping.   Your baby is wetting less than 3 diapers in a 24-hour period.  Your baby has less than 3 stools in a 24-hour period.  Your baby's skin or the white part of his or her eyes becomes yellow.   Your baby is not gaining weight by 11 days of age. SEEK IMMEDIATE MEDICAL CARE IF:  Your baby is overly tired (lethargic) and does not want to wake up and feed.  Your baby develops an unexplained fever.   This information is not intended to replace advice given to you by your health care provider. Make sure you discuss any questions you have with your health care provider.   Document Released: 01/22/2005 Document Revised: 10/13/2014 Document Reviewed: 07/16/2012 Elsevier Interactive Patient Education 2016 [employer].  Pregnancy and Smoking Smoking during pregnancy is unhealthy for you and your developing baby. The addictive drug nicotine, carbon monoxide, and many other poisons are inhaled from a cigarette and carried through your  bloodstream to your baby. Cigarette smoke contains more than 2,500 chemicals. It is not known which of these are harmful to a developing baby. However, both nicotine and carbon monoxide play a role in causing health problems in pregnancy. Smoking during pregnancy increases the risk of:  Birth defects in your baby, including heart defects.  Miscarriage and stillbirth.  Birth before 37 completed weeks of pregnancy (premature birth).  Pregnancy outside of the uterus (tubal pregnancy).  Attachment of the placenta over the opening of the uterus (placenta previa).  Detachment of the placenta before the baby's birth (placental abruption).  Breaking of the bag of waters before labor begins (premature rupture of membranes). HOW DOES SMOKING DURING PREGNANCY AFFECT MY BABY? Before Birth Smoking during pregnancy:  Decreases blood flow and oxygen to your baby.  Increases the heart rate of your baby.  Slows your baby's growth in  the uterus (intrauterine growth retardation). After Birth Babies born to women who smoke during pregnancy are more likely to have a low birth weight. They are also at risk for:  Serious health problems, chronic or lifelong disabilities (cerebral palsy, mental retardation, learning problems), and death.  Sudden infant death syndrome (SIDS).  Lung and breathing problems. WHAT RESOURCES ARE AVAILABLE TO HELP ME STOP SMOKING?  Ask your health care provider for help to stop smoking. The following resources are available:  Counseling.  Psychological treatment.  Acupuncture.  Family intervention.  Hypnosis.  Nicotine supplements have not been studied enough to know if they are safe to use during pregnancy. They should only be considered when all other methods fail, and if used under the close supervision of your health care provider.  Telephone QUIT lines. The national smoking cessation telephone hotline number is 1-800-QUIT NOW. FOR MORE INFORMATION  American  Cancer Society: www.cancer.org  American Heart Association: www.heart.org  National Cancer Institute: www.cancer.gov  March of Dimes: www.marchofdimes.org   This information is not intended to replace advice given to you by your health care provider. Make sure you discuss any questions you have with your health care provider.   Document Released: 06/05/2004 Document Revised: 01/27/2013 Document Reviewed: 12/22/2012 Elsevier Interactive Patient Education [employer] Inc.
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
## 2015-05-02 NOTE — Progress Notes (Signed)
U/S scheduled 03/29 @ 11:15

## 2015-05-02 NOTE — Progress Notes (Signed)
Pain- lower abd  Initial labs today

## 2015-05-03 LAB — PRENATAL PROFILE (SOLSTAS)
ANTIBODY SCREEN: NEGATIVE
BASOS ABS: 0 10*3/uL (ref 0.0–0.1)
Basophils Relative: 0 % (ref 0–1)
EOS ABS: 0.1 10*3/uL (ref 0.0–0.7)
Eosinophils Relative: 1 % (ref 0–5)
HCT: 33.6 % — ABNORMAL LOW (ref 36.0–46.0)
HEP B S AG: NEGATIVE
HIV 1&2 Ab, 4th Generation: NONREACTIVE
Hemoglobin: 11.1 g/dL — ABNORMAL LOW (ref 12.0–15.0)
LYMPHS ABS: 2.1 10*3/uL (ref 0.7–4.0)
LYMPHS PCT: 20 % (ref 12–46)
MCH: 28.3 pg (ref 26.0–34.0)
MCHC: 33 g/dL (ref 30.0–36.0)
MCV: 85.7 fL (ref 78.0–100.0)
MPV: 12.1 fL (ref 8.6–12.4)
Monocytes Absolute: 0.9 10*3/uL (ref 0.1–1.0)
Monocytes Relative: 8 % (ref 3–12)
NEUTROS ABS: 7.6 10*3/uL (ref 1.7–7.7)
NEUTROS PCT: 71 % (ref 43–77)
PLATELETS: 198 10*3/uL (ref 150–400)
RBC: 3.92 MIL/uL (ref 3.87–5.11)
RDW: 14.6 % (ref 11.5–15.5)
Rh Type: POSITIVE
Rubella: 1 Index — ABNORMAL HIGH (ref <0.90)
WBC: 10.7 10*3/uL — ABNORMAL HIGH (ref 4.0–10.5)

## 2015-05-03 LAB — CULTURE, OB URINE: Colony Count: 4000

## 2015-05-03 LAB — CYTOLOGY - PAP

## 2015-05-04 ENCOUNTER — Ambulatory Visit (HOSPITAL_COMMUNITY)
Admission: RE | Admit: 2015-05-04 | Discharge: 2015-05-04 | Disposition: A | Discharge status: Home / Self Care | Payer: Medicaid Other | Source: Ambulatory Visit | Attending: Obstetrics and Gynecology | Admitting: Obstetrics and Gynecology

## 2015-05-04 DIAGNOSIS — O99513 Diseases of the respiratory system complicating pregnancy, third trimester: Secondary | ICD-10-CM | POA: Insufficient documentation

## 2015-05-04 DIAGNOSIS — O0933 Supervision of pregnancy with insufficient antenatal care, third trimester: Secondary | ICD-10-CM | POA: Diagnosis present

## 2015-05-04 DIAGNOSIS — Z36 Encounter for antenatal screening of mother: Secondary | ICD-10-CM | POA: Insufficient documentation

## 2015-05-04 DIAGNOSIS — J45909 Unspecified asthma, uncomplicated: Secondary | ICD-10-CM | POA: Diagnosis not present

## 2015-05-04 DIAGNOSIS — Z3A29 29 weeks gestation of pregnancy: Secondary | ICD-10-CM | POA: Diagnosis not present

## 2015-05-04 DIAGNOSIS — O99333 Smoking (tobacco) complicating pregnancy, third trimester: Secondary | ICD-10-CM | POA: Diagnosis not present

## 2015-05-04 DIAGNOSIS — O0932 Supervision of pregnancy with insufficient antenatal care, second trimester: Secondary | ICD-10-CM

## 2015-05-04 LAB — HEMOGLOBINOPATHY EVALUATION
HGB S QUANTITAION: 0 %
Hemoglobin Other: 0 %
Hgb A2 Quant: 2.5 % (ref 2.2–3.2)
Hgb A: 97.5 % (ref 96.8–97.8)
Hgb F Quant: 0 % (ref 0.0–2.0)

## 2015-05-05 ENCOUNTER — Encounter: Payer: Self-pay | Admitting: Advanced Practice Midwife

## 2015-05-05 ENCOUNTER — Encounter: Payer: Self-pay | Admitting: Obstetrics and Gynecology

## 2015-05-05 DIAGNOSIS — R87613 High grade squamous intraepithelial lesion on cytologic smear of cervix (HGSIL): Secondary | ICD-10-CM | POA: Insufficient documentation

## 2015-05-05 HISTORY — DX: High grade squamous intraepithelial lesion on cytologic smear of cervix (HGSIL): R87.613

## 2015-05-05 LAB — CANNABANOIDS (GC/LC/MS), URINE: THC-COOH (GC/LC/MS), ur confirm: 434 ng/mL — AB (ref <5)

## 2015-05-06 LAB — PRESCRIPTION MONITORING PROFILE (19 PANEL)
AMPHETAMINE/METH: NEGATIVE ng/mL
BARBITURATE SCREEN, URINE: NEGATIVE ng/mL
BENZODIAZEPINE SCREEN, URINE: NEGATIVE ng/mL
BUPRENORPHINE, URINE: NEGATIVE ng/mL
Carisoprodol, Urine: NEGATIVE ng/mL
Cocaine Metabolites: NEGATIVE ng/mL
Creatinine, Urine: 138.23 mg/dL (ref >20.0)
Fentanyl, Ur: NEGATIVE ng/mL
MDMA URINE: NEGATIVE ng/mL
METHADONE SCREEN, URINE: NEGATIVE ng/mL
METHAQUALONE SCREEN (URINE): NEGATIVE ng/mL
Meperidine, Ur: NEGATIVE ng/mL
NITRITES URINE, INITIAL: NEGATIVE ug/mL
OPIATE SCREEN, URINE: NEGATIVE ng/mL
Oxycodone Screen, Ur: NEGATIVE ng/mL
PHENCYCLIDINE, UR: NEGATIVE ng/mL
Propoxyphene: NEGATIVE ng/mL
TAPENTADOLUR: NEGATIVE ng/mL
Tramadol Scrn, Ur: NEGATIVE ng/mL
ZOLPIDEM, URINE: NEGATIVE ng/mL
pH, Initial: 7.6 pH (ref 4.5–8.9)

## 2015-05-18 ENCOUNTER — Encounter: Payer: Self-pay | Admitting: Certified Nurse Midwife

## 2015-06-07 ENCOUNTER — Ambulatory Visit (INDEPENDENT_AMBULATORY_CARE_PROVIDER_SITE_OTHER): Payer: Self-pay | Admitting: Advanced Practice Midwife

## 2015-06-07 ENCOUNTER — Encounter: Payer: Self-pay | Admitting: Advanced Practice Midwife

## 2015-06-07 VITALS — BP 121/62 | HR 75 | Wt 197.4 lb

## 2015-06-07 DIAGNOSIS — R87613 High grade squamous intraepithelial lesion on cytologic smear of cervix (HGSIL): Secondary | ICD-10-CM

## 2015-06-07 DIAGNOSIS — Z3483 Encounter for supervision of other normal pregnancy, third trimester: Secondary | ICD-10-CM

## 2015-06-07 DIAGNOSIS — Z3482 Encounter for supervision of other normal pregnancy, second trimester: Secondary | ICD-10-CM

## 2015-06-07 LAB — POCT URINALYSIS DIP (DEVICE)
Bilirubin Urine: NEGATIVE
GLUCOSE, UA: NEGATIVE mg/dL
Hgb urine dipstick: NEGATIVE
Ketones, ur: NEGATIVE mg/dL
LEUKOCYTES UA: NEGATIVE
Nitrite: NEGATIVE
Protein, ur: 30 mg/dL — AB
SPECIFIC GRAVITY, URINE: 1.02 (ref 1.005–1.030)
UROBILINOGEN UA: 2 mg/dL — AB (ref 0.0–1.0)
pH: 7.5 (ref 5.0–8.0)

## 2015-06-07 NOTE — Patient Instructions (Addendum)
Pap Test WHY AM I HAVING THIS TEST? A pap test is sometimes called a pap smear. It is a screening test that is used to check for signs of cancer of the vagina, cervix, and uterus. The test can also identify the presence of infection or precancerous changes. Your health care provider will likely recommend you have this test done on a regular basis. This test may be done:  Every 3 years, starting at age 30.  Every 5 years, in combination with testing for the presence of human papillomavirus (HPV).  More or less often depending on other medical conditions.  WHAT KIND OF SAMPLE IS TAKEN? Using a small cotton swab, plastic spatula, or brush, your health care provider will collect a sample of cells from the surface of your cervix. Your cervix is the opening to your uterus, also called a womb. Secretions from the cervix and vagina may also be collected. HOW DO I PREPARE FOR THE TEST?  Be aware of where you are in your menstrual cycle. You may be asked to reschedule the test if you are menstruating on the day of the test.  You may need to reschedule if you have a known vaginal infection on the day of the test.  You may be asked to avoid douching or taking a bath the day before or the day of the test.  Some medicines can cause abnormal test results, such as digitalis and tetracycline. Talk with your health care provider before your test if you take one of these medicines. WHAT DO THE RESULTS MEAN? Abnormal test results may indicate a number of health conditions. These may include:  Cancer. Although pap test results cannot be used to diagnose cancer of the cervix, vagina, or uterus, they may suggest the possibility of cancer. Further tests would be required to determine if cancer is present.  Sexually transmitted disease.  Fungal infection.  Parasite infection.  Herpes infection.  A condition causing or contributing to infertility. It is your responsibility to obtain your test results. Ask  the lab or department performing the test when and how you will get your results. Contact your health care provider to discuss any questions you have about your results.   This information is not intended to replace advice given to you by your health care provider. Make sure you discuss any questions you have with your health care provider.   Document Released: 04/14/2002 Document Revised: 02/12/2014 Document Reviewed: 06/15/2013 Elsevier Interactive Patient Education 2016 Elsevier Inc. Cervical Dysplasia Cervical dysplasia is a condition in which a woman has abnormal changes in the cells of her cervix. The cervix is the opening to the uterus (womb). It is located between the vagina and the uterus. Cervical dysplasia may be the first sign of cervical cancer.  With early detection, treatment, and close follow-up care, nearly all cases of cervical dysplasia can be cured. If left untreated, dysplasia may become more severe.  CAUSES  Cervical dysplasia can be caused by a human papillomavirus (HPV) infection. RISK FACTORS   Having had a sexually transmitted disease, such as chlamydia or a human papillomavirus (HPV) infection.   Becoming sexually active before age 30.   Having had more than one sexual partner.   Not using protection during sexual intercourse, especially with new sexual partners.   Having had cancer of the vagina or vulva.   Having a sexual partner whose previous partner had cancer of the cervix or cervical dysplasia.   Having a sexual partner who has or has  had cancer of the penis.   Having a weakened immune system (such as from having HIV or an organ transplant).   Being the daughter of a woman who took diethylstilbestrol(DES) during pregnancy.   Having a family history of cervical cancer.   Smoking. SIGNS AND SYMPTOMS  There are usually no symptoms. If there are symptoms, they may include:   Abnormal vaginal discharge.   Bleeding between periods or  after intercourse.   Bleeding during menopause.   Pain during sexual intercourse (dyspareunia). DIAGNOSIS  A test called a Pap test may be done.During this test, cells are taken from the cervix and then looked at under a microscope. A test in which tissue is removed from the cervix (biopsy) may also be done if the Pap test is abnormal or if the cervix looks abnormal.  TREATMENT  Treatment varies based on the severity of the cervical dysplasia. Treatment may include:  Cryotherapy. During cryotherapy, the abnormal cells are frozen with a steel-tip instrument.   A procedure to remove abnormal tissue from the cervix.  Surgery to remove abnormal tissue. This is usually done in serious cases of cervical dysplasia. Surgical options include:  A cone biopsy. This is a procedure in which the cervical canal and a portion of the center of the cervix are removed.   Hysterectomy. This is a surgery in which the uterus and cervix are removed. HOME CARE INSTRUCTIONS   Only take over-the-counter or prescription medicines for pain or discomfort as directed by your health care provider.   Do not use tampons, have sexual intercourse, or douche until your health care provider says it is okay.  Keep follow-up appointments as directed by your health care provider. Women who have been treated for cervical dysplasia should have regular pelvic exams and Pap tests. During the first year following treatment of cervical dysplasia, Pap tests should be done every 3-4 months. In the second year, they should be done every 6 months or as recommended by your health care provider.  To prevent the condition from developing again, practice safe sex. SEEK MEDICAL CARE IF:  You develop genital warts.  SEEK IMMEDIATE MEDICAL CARE IF:   Your menstrual period is heavier than normal.   You develop bright red bleeding, especially if you have blood clots.   You have a fever.   You have increasing cramps or pain  not relieved with medicine.   You are light-headed, unusually weak, or have fainting spells.   You have abnormal vaginal discharge.   You have abdominal pain.   This information is not intended to replace advice given to you by your health care provider. Make sure you discuss any questions you have with your health care provider.   Document Released: 01/22/2005 Document Revised: 01/27/2013 Document Reviewed: 09/17/2012 Elsevier Interactive Patient Education [employer]

## 2015-06-07 NOTE — Progress Notes (Signed)
Subjective:  Clarice Poleanneisha R Loppnow is a 30 y.o. [phone] at 1574w3d being seen today for ongoing prenatal care.  She is currently monitored for the following issues for this low-risk pregnancy and has Supervision of other normal pregnancy, antepartum; Insufficient prenatal care; Grand multiparity with current pregnancy, antepartum; Tobacco smoking affecting pregnancy in third trimester, antepartum; and HGSIL (high grade squamous intraepithelial lesion) on Pap smear of cervix on her problem list.  No Showed her last appointment. Has not had care in the past 5 weeks.   States has not completed the info for her Medicaid card yet  Patient reports no complaints.  Contractions: Irregular. Vag. Bleeding: None.  Movement: Present. Denies leaking of fluid.   The following portions of the patient's history were reviewed and updated as appropriate: allergies, current medications, past family history, past medical history, past social history, past surgical history and problem list. Problem list updated.  Objective:   Filed Vitals:   06/07/15 1040  BP: 121/62  Pulse: 75  Weight: 197 lb 6.4 oz (89.54 kg)    Fetal Status: Fetal Heart Rate (bpm): 140   Movement: Present     General:  Alert, oriented and cooperative. Patient is in no acute distress.  Skin: Skin is warm and dry. No rash noted.   Cardiovascular: Normal heart rate noted  Respiratory: Normal respiratory effort, no problems with respiration noted  Abdomen: Soft, gravid, appropriate for gestational age. Pain/Pressure: Present     Pelvic: Vag. Bleeding: None     Cervical exam deferred        Extremities: Normal range of motion.  Edema: None  Mental Status: Normal mood and affect. Normal behavior. Normal judgment and thought content.   Urinalysis: Urine Protein: 1+ Urine Glucose: Negative  Assessment and Plan:  Pregnancy: [id number] at 8474w3d  1. HGSIL (high grade squamous intraepithelial lesion) on Pap smear of cervix      Consulted Dr Shawnie PonsPratt.  Since it is so late, OK to defer Colpo until postpartum Patient informed  2. Supervision of other normal pregnancy, antepartum, second trimester      Encouraged to finish application for medicaid  Preterm labor symptoms and general obstetric precautions including but not limited to vaginal bleeding, contractions, leaking of fluid and fetal movement were reviewed in detail with the patient. Please refer to After Visit Summary for other counseling recommendations.  RTC 2 weeks  Aviva SignsMarie L Arelly Whittenberg, CNM

## 2015-06-21 ENCOUNTER — Ambulatory Visit (INDEPENDENT_AMBULATORY_CARE_PROVIDER_SITE_OTHER): Payer: Self-pay | Admitting: Family

## 2015-06-21 VITALS — BP 134/67 | HR 78 | Wt 198.5 lb

## 2015-06-21 DIAGNOSIS — Z3483 Encounter for supervision of other normal pregnancy, third trimester: Secondary | ICD-10-CM

## 2015-06-21 LAB — POCT URINALYSIS DIP (DEVICE)
BILIRUBIN URINE: NEGATIVE
Glucose, UA: NEGATIVE mg/dL
HGB URINE DIPSTICK: NEGATIVE
KETONES UR: NEGATIVE mg/dL
Leukocytes, UA: NEGATIVE
NITRITE: NEGATIVE
PH: 7 (ref 5.0–8.0)
PROTEIN: 30 mg/dL — AB
Specific Gravity, Urine: 1.015 (ref 1.005–1.030)
Urobilinogen, UA: 2 mg/dL — ABNORMAL HIGH (ref 0.0–1.0)

## 2015-06-21 NOTE — Progress Notes (Signed)
Subjective:  Clarice Poleanneisha R Snowdon is a 30 y.o. [phone] at 5113w3d being seen today for ongoing prenatal care.  She is currently monitored for the following issues for this low-risk pregnancy and has Supervision of other normal pregnancy, antepartum; Insufficient prenatal care; Grand multiparity with current pregnancy, antepartum; Tobacco smoking affecting pregnancy in third trimester, antepartum; and HGSIL (high grade squamous intraepithelial lesion) on Pap smear of cervix on her problem list.  Patient reports no complaints.  Contractions: Not present.  .  Movement: Present. Denies leaking of fluid.   The following portions of the patient's history were reviewed and updated as appropriate: allergies, current medications, past family history, past medical history, past social history, past surgical history and problem list. Problem list updated.  Objective:   Filed Vitals:   06/21/15 1551  BP: 134/67  Pulse: 78  Weight: 198 lb 8 oz (90.039 kg)    Fetal Status: Fetal Heart Rate (bpm): 140 Fundal Height: 36 cm Movement: Present     General:  Alert, oriented and cooperative. Patient is in no acute distress.  Skin: Skin is warm and dry. No rash noted.   Cardiovascular: Normal heart rate noted  Respiratory: Normal respiratory effort, no problems with respiration noted  Abdomen: Soft, gravid, appropriate for gestational age. Pain/Pressure: Present     Pelvic:       Cervical exam deferred        Extremities: Normal range of motion.  Edema: None  Mental Status: Normal mood and affect. Normal behavior. Normal judgment and thought content.   Urinalysis: Urine Protein: 1+ Urine Glucose: Negative  Assessment and Plan:  Pregnancy: [id number] at 9613w3d  1. Supervision of other normal pregnancy, antepartum, third trimester - Unable to stay for GBS/GC CT today, will get at next visit  Preterm labor symptoms and general obstetric precautions including but not limited to vaginal bleeding, contractions,  leaking of fluid and fetal movement were reviewed in detail with the patient. Please refer to After Visit Summary for other counseling recommendations.  Return in about 1 week (around 06/28/2015).   Eino FarberWalidah Kennith GainN Karim, CNM

## 2015-06-29 ENCOUNTER — Ambulatory Visit (INDEPENDENT_AMBULATORY_CARE_PROVIDER_SITE_OTHER): Payer: Medicaid Other | Admitting: Family

## 2015-06-29 VITALS — BP 100/60 | HR 84 | Wt 197.9 lb

## 2015-06-29 DIAGNOSIS — Z113 Encounter for screening for infections with a predominantly sexual mode of transmission: Secondary | ICD-10-CM | POA: Diagnosis not present

## 2015-06-29 DIAGNOSIS — O0933 Supervision of pregnancy with insufficient antenatal care, third trimester: Secondary | ICD-10-CM

## 2015-06-29 DIAGNOSIS — Z3483 Encounter for supervision of other normal pregnancy, third trimester: Secondary | ICD-10-CM

## 2015-06-29 LAB — POCT URINALYSIS DIP (DEVICE)
BILIRUBIN URINE: NEGATIVE
Glucose, UA: NEGATIVE mg/dL
Hgb urine dipstick: NEGATIVE
KETONES UR: NEGATIVE mg/dL
LEUKOCYTES UA: NEGATIVE
NITRITE: NEGATIVE
Protein, ur: NEGATIVE mg/dL
Specific Gravity, Urine: 1.015 (ref 1.005–1.030)
Urobilinogen, UA: 2 mg/dL — ABNORMAL HIGH (ref 0.0–1.0)
pH: 7 (ref 5.0–8.0)

## 2015-06-29 NOTE — Progress Notes (Signed)
GBS GC/CH

## 2015-06-29 NOTE — Progress Notes (Signed)
Subjective:  Clarice Poleanneisha R Eoff is a 30 y.o. [phone] at 5467w4d being seen today for ongoing prenatal care.  She is currently monitored for the following issues for this low-risk pregnancy and has Supervision of other normal pregnancy, antepartum; Insufficient prenatal care; Grand multiparity with current pregnancy, antepartum; Tobacco smoking affecting pregnancy in third trimester, antepartum; and HGSIL (high grade squamous intraepithelial lesion) on Pap smear of cervix on her problem list.  Patient reports no complaints.  Contractions: Irregular. Vag. Bleeding: None.  Movement: Present. Denies leaking of fluid.   The following portions of the patient's history were reviewed and updated as appropriate: allergies, current medications, past family history, past medical history, past social history, past surgical history and problem list. Problem list updated.  Objective:   Filed Vitals:   06/29/15 0920  BP: 100/60  Pulse: 84  Weight: 197 lb 14.4 oz (89.767 kg)    Fetal Status: Fetal Heart Rate (bpm): 146 Fundal Height: 37 cm Movement: Present  Presentation: Vertex  General:  Alert, oriented and cooperative. Patient is in no acute distress.  Skin: Skin is warm and dry. No rash noted.   Cardiovascular: Normal heart rate noted  Respiratory: Normal respiratory effort, no problems with respiration noted  Abdomen: Soft, gravid, appropriate for gestational age. Pain/Pressure: Present     Pelvic: Vag. Bleeding: None Vag D/C Character: Mucous   Cervical exam performed Dilation: 1 Effacement (%): Thick Station: Ballotable  Extremities: Normal range of motion.  Edema: None  Mental Status: Normal mood and affect. Normal behavior. Normal judgment and thought content.   Urinalysis: Urine Protein: Negative Urine Glucose: Negative  Assessment and Plan:  Pregnancy: [id number] at 8567w4d  1. Supervision of other normal pregnancy, antepartum, third trimester - Culture, beta strep (group b only) - GC/Chlamydia  probe amp [facility]  2. Insufficient prenatal care, third trimester - Continue scheduled visits  Term labor symptoms and general obstetric precautions including but not limited to vaginal bleeding, contractions, leaking of fluid and fetal movement were reviewed in detail with the patient. Please refer to After Visit Summary for other counseling recommendations.  Return in about 1 week (around 07/06/2015).   Eino FarberWalidah Kennith GainN Karim, CNM

## 2015-06-30 LAB — GC/CHLAMYDIA PROBE AMP (~~LOC~~) NOT AT ARMC
Chlamydia: NEGATIVE
Neisseria Gonorrhea: NEGATIVE

## 2015-07-01 LAB — CULTURE, BETA STREP (GROUP B ONLY)

## 2015-07-10 ENCOUNTER — Encounter (HOSPITAL_COMMUNITY): Payer: Self-pay

## 2015-07-10 ENCOUNTER — Inpatient Hospital Stay (HOSPITAL_COMMUNITY)
Admission: AD | Admit: 2015-07-10 | Discharge: 2015-07-11 | DRG: 775 | Disposition: A | Discharge status: Home / Self Care | Payer: Medicaid Other | Source: Ambulatory Visit | Attending: Obstetrics & Gynecology | Admitting: Obstetrics & Gynecology

## 2015-07-10 DIAGNOSIS — O0933 Supervision of pregnancy with insufficient antenatal care, third trimester: Secondary | ICD-10-CM

## 2015-07-10 DIAGNOSIS — F17210 Nicotine dependence, cigarettes, uncomplicated: Secondary | ICD-10-CM | POA: Diagnosis present

## 2015-07-10 DIAGNOSIS — Z3483 Encounter for supervision of other normal pregnancy, third trimester: Secondary | ICD-10-CM

## 2015-07-10 DIAGNOSIS — Z23 Encounter for immunization: Secondary | ICD-10-CM | POA: Diagnosis not present

## 2015-07-10 DIAGNOSIS — R87613 High grade squamous intraepithelial lesion on cytologic smear of cervix (HGSIL): Secondary | ICD-10-CM

## 2015-07-10 DIAGNOSIS — O0942 Supervision of pregnancy with grand multiparity, second trimester: Secondary | ICD-10-CM

## 2015-07-10 DIAGNOSIS — IMO0001 Reserved for inherently not codable concepts without codable children: Secondary | ICD-10-CM

## 2015-07-10 DIAGNOSIS — O99334 Smoking (tobacco) complicating childbirth: Secondary | ICD-10-CM | POA: Diagnosis present

## 2015-07-10 DIAGNOSIS — Z3A39 39 weeks gestation of pregnancy: Secondary | ICD-10-CM

## 2015-07-10 DIAGNOSIS — O99333 Smoking (tobacco) complicating pregnancy, third trimester: Secondary | ICD-10-CM

## 2015-07-10 LAB — CBC
HCT: 35.2 % — ABNORMAL LOW (ref 36.0–46.0)
HEMOGLOBIN: 12.2 g/dL (ref 12.0–15.0)
MCH: 28.6 pg (ref 26.0–34.0)
MCHC: 34.7 g/dL (ref 30.0–36.0)
MCV: 82.6 fL (ref 78.0–100.0)
PLATELETS: 189 10*3/uL (ref 150–400)
RBC: 4.26 MIL/uL (ref 3.87–5.11)
RDW: 14.6 % (ref 11.5–15.5)
WBC: 12.5 10*3/uL — AB (ref 4.0–10.5)

## 2015-07-10 LAB — TYPE AND SCREEN
ABO/RH(D): O POS
Antibody Screen: NEGATIVE

## 2015-07-10 MED ORDER — SENNOSIDES-DOCUSATE SODIUM 8.6-50 MG PO TABS
2.0000 | ORAL_TABLET | ORAL | Status: DC
  Administered 2015-07-10: 2 via ORAL
  Filled 2015-07-10: qty 2

## 2015-07-10 MED ORDER — LACTATED RINGERS IV SOLN
INTRAVENOUS | Status: DC
  Administered 2015-07-10: 14:00:00 via INTRAVENOUS

## 2015-07-10 MED ORDER — FENTANYL CITRATE (PF) 100 MCG/2ML IJ SOLN
INTRAMUSCULAR | Status: AC
  Filled 2015-07-10: qty 2

## 2015-07-10 MED ORDER — TETANUS-DIPHTH-ACELL PERTUSSIS 5-2.5-18.5 LF-MCG/0.5 IM SUSP: 0.5000 mL | Freq: Once | INTRAMUSCULAR | Status: DC

## 2015-07-10 MED ORDER — ONDANSETRON HCL 4 MG/2ML IJ SOLN: 4.0000 mg | Freq: Four times a day (QID) | INTRAMUSCULAR | Status: DC | PRN

## 2015-07-10 MED ORDER — ONDANSETRON HCL 4 MG PO TABS
4.0000 mg | ORAL_TABLET | ORAL | Status: DC | PRN
Start: 2015-07-10 — End: 2015-07-11

## 2015-07-10 MED ORDER — PRENATAL MULTIVITAMIN CH
1.0000 | ORAL_TABLET | Freq: Every day | ORAL | Status: DC
  Administered 2015-07-11: 1 via ORAL
  Filled 2015-07-10: qty 1

## 2015-07-10 MED ORDER — WITCH HAZEL-GLYCERIN EX PADS: 1.0000 "application " | MEDICATED_PAD | CUTANEOUS | Status: DC | PRN

## 2015-07-10 MED ORDER — DIBUCAINE 1 % RE OINT: 1.0000 "application " | TOPICAL_OINTMENT | RECTAL | Status: DC | PRN

## 2015-07-10 MED ORDER — FLEET ENEMA 7-19 GM/118ML RE ENEM: 1.0000 | ENEMA | RECTAL | Status: DC | PRN

## 2015-07-10 MED ORDER — LIDOCAINE HCL (PF) 1 % IJ SOLN
30.0000 mL | INTRAMUSCULAR | Status: DC | PRN
  Filled 2015-07-10: qty 30

## 2015-07-10 MED ORDER — SODIUM CHLORIDE 0.9% FLUSH
3.0000 mL | Freq: Two times a day (BID) | INTRAVENOUS | Status: DC
Start: 2015-07-10 — End: 2015-07-11

## 2015-07-10 MED ORDER — OXYCODONE-ACETAMINOPHEN 5-325 MG PO TABS: 2.0000 | ORAL_TABLET | ORAL | Status: DC | PRN

## 2015-07-10 MED ORDER — SIMETHICONE 80 MG PO CHEW: 80.0000 mg | CHEWABLE_TABLET | ORAL | Status: DC | PRN

## 2015-07-10 MED ORDER — ZOLPIDEM TARTRATE 5 MG PO TABS: 5.0000 mg | ORAL_TABLET | Freq: Every evening | ORAL | Status: DC | PRN

## 2015-07-10 MED ORDER — DIPHENHYDRAMINE HCL 25 MG PO CAPS: 25.0000 mg | ORAL_CAPSULE | Freq: Four times a day (QID) | ORAL | Status: DC | PRN

## 2015-07-10 MED ORDER — IBUPROFEN 600 MG PO TABS
600.0000 mg | ORAL_TABLET | Freq: Four times a day (QID) | ORAL | Status: DC
  Administered 2015-07-10 – 2015-07-11 (×4): 600 mg via ORAL
  Filled 2015-07-10 (×3): qty 1

## 2015-07-10 MED ORDER — MEASLES, MUMPS & RUBELLA VAC ~~LOC~~ INJ
0.5000 mL | INJECTION | Freq: Once | SUBCUTANEOUS | Status: DC
  Filled 2015-07-10: qty 0.5

## 2015-07-10 MED ORDER — SOD CITRATE-CITRIC ACID 500-334 MG/5ML PO SOLN: 30.0000 mL | ORAL | Status: DC | PRN

## 2015-07-10 MED ORDER — OXYCODONE-ACETAMINOPHEN 5-325 MG PO TABS: 1.0000 | ORAL_TABLET | ORAL | Status: DC | PRN

## 2015-07-10 MED ORDER — ACETAMINOPHEN 325 MG PO TABS
650.0000 mg | ORAL_TABLET | ORAL | Status: DC | PRN
Start: 2015-07-10 — End: 2015-07-11

## 2015-07-10 MED ORDER — FENTANYL CITRATE (PF) 100 MCG/2ML IJ SOLN
100.0000 ug | INTRAMUSCULAR | Status: DC | PRN
  Administered 2015-07-10: 100 ug via INTRAVENOUS

## 2015-07-10 MED ORDER — SODIUM CHLORIDE 0.9% FLUSH: 3.0000 mL | INTRAVENOUS | Status: DC | PRN

## 2015-07-10 MED ORDER — LACTATED RINGERS IV SOLN: 500.0000 mL | INTRAVENOUS | Status: DC | PRN

## 2015-07-10 MED ORDER — ONDANSETRON HCL 4 MG/2ML IJ SOLN: 4.0000 mg | INTRAMUSCULAR | Status: DC | PRN

## 2015-07-10 MED ORDER — OXYTOCIN 40 UNITS IN LACTATED RINGERS INFUSION - SIMPLE MED
2.5000 [IU]/h | INTRAVENOUS | Status: DC
  Filled 2015-07-10: qty 1000

## 2015-07-10 MED ORDER — ACETAMINOPHEN 325 MG PO TABS: 650.0000 mg | ORAL_TABLET | ORAL | Status: DC | PRN

## 2015-07-10 MED ORDER — BENZOCAINE-MENTHOL 20-0.5 % EX AERO: 1.0000 "application " | INHALATION_SPRAY | CUTANEOUS | Status: DC | PRN

## 2015-07-10 MED ORDER — OXYTOCIN BOLUS FROM INFUSION
500.0000 mL | INTRAVENOUS | Status: DC
  Administered 2015-07-10: 500 mL via INTRAVENOUS

## 2015-07-10 MED ORDER — COCONUT OIL OIL: 1.0000 "application " | TOPICAL_OIL | Status: DC | PRN

## 2015-07-10 MED ORDER — SODIUM CHLORIDE 0.9 % IV SOLN: 250.0000 mL | INTRAVENOUS | Status: DC | PRN
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
## 2015-07-10 NOTE — MAU Note (Signed)
Notified Marlynn Perking. Lawson CNM patient G7P4 1259w1d here for labor check, 4/50/-3 posterior small amount of bloody show, irregular contractions, per CNM watch patient for an hour and recheck her cervix, if unchanged can go home.

## 2015-07-10 NOTE — MAU Note (Signed)
Received admit orders

## 2015-07-11 LAB — RPR: RPR: NONREACTIVE

## 2015-07-11 MED ORDER — IBUPROFEN 600 MG PO TABS: 600.0000 mg | ORAL_TABLET | Freq: Four times a day (QID) | ORAL | Status: DC

## 2015-07-11 MED ORDER — PRENATAL MULTIVITAMIN CH: 1.0000 | ORAL_TABLET | Freq: Every day | ORAL | Status: DC

## 2015-07-11 MED ORDER — PNEUMOCOCCAL VAC POLYVALENT 25 MCG/0.5ML IJ INJ
0.5000 mL | INJECTION | Freq: Once | INTRAMUSCULAR | Status: AC
  Administered 2015-07-11: 0.5 mL via INTRAMUSCULAR
  Filled 2015-07-11: qty 0.5

## 2015-07-11 NOTE — Clinical Social Work Maternal (Signed)
CLINICAL SOCIAL WORK MATERNAL/CHILD NOTE  Patient Details  Name: MICKIE BADDERS MRN: [id number] Date of Birth: 20-Mar-1985  Date:  07/11/2015  Clinical Social Worker Initiating Note:  Laurey Arrow Date/ Time Initiated:  07/11/15/1510     Child's Name:  Maura Crandall   Legal Guardian:  Mother   Need for Interpreter:  None   Date of Referral:  07/11/15     Reason for Referral:  Current Substance Use/Substance Use During Pregnancy    Referral Source:  Central Nursery   Address:  [address] [place], [place] [address]  Phone number:  [phone]   Household Members:  Self, Minor Children   Natural Supports (not living in the home):  Extended Family, Friends, Immediate Family, Parent, Spouse/significant other   Professional Supports: Case Metallurgist (Referral made to [employer])   Employment: Unemployed   Type of Work:     Education:  9 to 11 years   Museum/gallery curator Resources:  [employer]   Other Resources:  [employer], Physicist, medical    Cultural/Religious Considerations Which May Impact Care: per face sheet, MOB is non-denonminational  Strengths:  Ability to meet basic needs , Home prepared for child , Pediatrician chosen    Risk Factors/Current Problems:  Substance Use    Cognitive State:  Linear Thinking , Insightful , Goal Oriented , Alert    Mood/Affect:  Comfortable , Calm , Happy , Relaxed    CSW Assessment: CSW met with MOB for a consult for hx of marijuana use while pregnant. MOB was inviting, and appeared open to speaking with CSW.  MOB had room visitors, and MOB ask her visitors to leave so that she could speak with CSW in private. MOB communicated that she has 4 older children (3 girls ages 30, 26, and 41; 61 boy age 30) who are being taking care of by FOB and her mother. CSW completed an assessment, and offered MOB supports.  CSW informed MOB of the hospital's drug screen policy, and informed MOB of the 2 screenings for the infant. MOB  appeared understanding, and expressed that she understands the hospital's policy and procedures.  MOB communicated that she experienced a similar situation when she gave birth to her second child.  MOB denies having any CPS involvement, and she declined community resources for SA treatment.  MOB stated that she typically utilizes marijuana socially, or to assist with increasing her appetite. CSW informed MOB that the baby's UDS was positive or THC, and that a report will be made today. MOB did not have any questions or concerns.  CSW inquired about MOB supports and living situation.  MOB communicated that she resides in the home with her 4 other children, and she has the supports of her mother, FOB, sister, brother, and other extended family members. MOB disclosed that she and the FOB are currently not in a relationship, but FOB is very supportive of his children.  MOB stated that they are co-parenting.  MOB requested a referral be made to [facility].  MOB stated that she had the services in the past, and it was very helpful to her.  CSW educated MOB about PPD.  CSW informed MOB of possible supports and interventions to decrease PPD.  CSW listened while MOB shared some of her past symptoms of PPD.  MOB communicated she experienced uncontrollable crying, and feelings of being overwhelmed during the last month of her pregnancy.  CSW assured client that what she was feeling was absolutely normal.   CSW educated MOB  on different intervention and coping skills, like, healthy eating, resting when baby rest, and reaching out to supports for assistance. CSW encouraged MOB to seek medical attention if needed for increased signs and symptoms for PPD. CSW also reviewed safe sleep, and SIDS. MOB appeared knowledgeable.  MOB informed CSW that she has a bassinet, and all other needed items for the baby.  CSW thanked MOB her time and MOB stated that she did not have any further questions, concerns, or needs at this time.    CSW  Plan/Description:  Child Protective Service Report  (Positive UDS for THC )    ANGEL D BOYD-GILYARD, LCSW 07/11/2015, 3:31 PM

## 2015-07-11 NOTE — Discharge Summary (Signed)
OB Discharge Summary  Patient Name: Heather Good DOB: Jun 09, 1985 MRN: [id number]  Date of admission: 07/10/2015 Delivering MD: Wyvonnia DuskyLAWSON, MARIE D   Date of discharge: 07/11/2015  Admitting diagnosis: CONTRACTIONS AND MUCUS Intrauterine pregnancy: 43106w1d     Secondary diagnosis:Active Problems:   Active labor at term  Additional problems:none     Discharge diagnosis: Term Pregnancy Delivered                                                                     Post partum procedures:none  Augmentation: Pitocin  Complications: None  Hospital course:  Onset of Labor With Vaginal Delivery     30 y.o. yo [id number] at 79106w1d was admitted in Active Labor on 07/10/2015. Patient had an uncomplicated labor course as follows:  Membrane Rupture Time/Date: 1:51 PM ,07/10/2015   Intrapartum Procedures: Episiotomy: None [1]                                         Lacerations:  None [1]  Patient had a delivery of a Viable infant. 07/10/2015  Information for the patient's newborn:  Rande LawmanDumas, Boy Nadezhda [[id number]][[id number]]  Delivery Method: Vaginal, Spontaneous Delivery (Filed from Delivery Summary)    Pateint had an uncomplicated postpartum course.  She is ambulating, tolerating a regular diet, passing flatus, and urinating well. Patient is discharged home in stable condition on 07/11/2015.    Physical exam  Filed Vitals:   07/10/15 1646 07/10/15 1803 07/10/15 2105 07/11/15 0512  BP: 114/62 128/69 102/64 113/58  Pulse: 50 69 68 69  Temp: 97.7 F (36.5 C) 97.7 F (36.5 C) 98 F (36.7 C) 98.3 F (36.8 C)  TempSrc: Oral Oral Oral Oral  Resp: 18 20 18 18   Height:      Weight:      SpO2:       General: alert, cooperative and no distress Lochia: appropriate Uterine Fundus: firm Incision: N/A DVT Evaluation: No evidence of DVT seen on physical exam. Negative Homan's sign. No cords or calf tenderness. Labs: Lab Results  Component Value Date   WBC 12.5* 07/10/2015   HGB 12.2 07/10/2015   HCT 35.2* 07/10/2015   MCV 82.6 07/10/2015   PLT 189 07/10/2015   CMP 09/03/2006  Glucose 92  BUN 8  Creatinine 0.5  Sodium 136  Potassium 3.6  Chloride 106    Discharge instruction: per After Visit Summary and "Baby and Me Booklet".  After Visit Meds:    Medication List    ASK your doctor about these medications        acetaminophen 500 MG tablet  Commonly known as:  TYLENOL  Take 500 mg by mouth every 6 (six) hours as needed for moderate pain. Reported on 06/29/2015     metroNIDAZOLE 500 MG tablet  Commonly known as:  FLAGYL  Take 1 tablet (500 mg total) by mouth 2 (two) times daily.        Diet: routine diet  Activity: Advance as tolerated. Pelvic rest for 6 weeks.   Outpatient follow up:6 weeks Follow up Appt:Future Appointments Date Time Provider Department Center  07/13/2015 1:30 PM Currie ParisJames G  Debroah Loop, MD [facility]   Follow up visit: No Follow-up on file.  Postpartum contraception: Depo Provera and Tubal Ligation  Newborn Data: Live born female  Birth Weight: 6 lb 4.2 oz (2841 g) APGAR: 8, 9  Baby Feeding: Bottle Disposition:home with mother   07/11/2015 [name], CNM

## 2015-07-13 ENCOUNTER — Encounter: Payer: Self-pay | Admitting: Obstetrics & Gynecology

## 2015-09-13 ENCOUNTER — Ambulatory Visit: Payer: Medicaid Other | Admitting: Family

## 2015-09-13 ENCOUNTER — Inpatient Hospital Stay (HOSPITAL_COMMUNITY)
Admission: AD | Admit: 2015-09-13 | Discharge: 2015-09-13 | Disposition: A | Discharge status: Home / Self Care | Payer: Medicaid Other | Source: Ambulatory Visit | Attending: Obstetrics and Gynecology | Admitting: Obstetrics and Gynecology

## 2015-09-13 ENCOUNTER — Encounter (HOSPITAL_COMMUNITY): Payer: Self-pay

## 2015-09-13 DIAGNOSIS — Z202 Contact with and (suspected) exposure to infections with a predominantly sexual mode of transmission: Secondary | ICD-10-CM

## 2015-09-13 DIAGNOSIS — Z113 Encounter for screening for infections with a predominantly sexual mode of transmission: Secondary | ICD-10-CM | POA: Diagnosis not present

## 2015-09-13 DIAGNOSIS — F17210 Nicotine dependence, cigarettes, uncomplicated: Secondary | ICD-10-CM | POA: Diagnosis not present

## 2015-09-13 LAB — URINALYSIS, ROUTINE W REFLEX MICROSCOPIC
Bilirubin Urine: NEGATIVE
GLUCOSE, UA: NEGATIVE mg/dL
Hgb urine dipstick: NEGATIVE
Ketones, ur: NEGATIVE mg/dL
LEUKOCYTES UA: NEGATIVE
NITRITE: NEGATIVE
PH: 6.5 (ref 5.0–8.0)
Protein, ur: NEGATIVE mg/dL
SPECIFIC GRAVITY, URINE: 1.02 (ref 1.005–1.030)

## 2015-09-13 LAB — WET PREP, GENITAL
CLUE CELLS WET PREP: NONE SEEN
SPERM: NONE SEEN
TRICH WET PREP: NONE SEEN
Yeast Wet Prep HPF POC: NONE SEEN

## 2015-09-13 LAB — POCT PREGNANCY, URINE: Preg Test, Ur: NEGATIVE
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
## 2015-09-13 NOTE — MAU Note (Signed)
Pt complains of burning and itching in vagina.  Began last night after having intercourse.  Last period was beginning of August.

## 2015-09-13 NOTE — Discharge Instructions (Signed)
Gonorrhea Gonorrhea is an infection that can cause serious problems. If left untreated, the infection may:   Damage the female or female organs.   Cause women to be unable to have children (sterility).   Harm a fetus if the infected woman is pregnant.  It is important to get treatment for gonorrhea as soon as possible. It is also necessary that all your sexual partners be tested for the infection.  CAUSES  Gonorrhea is caused by bacteria called Neisseria gonorrhoeae. The infection is spread from person to person, usually by sexual contact (such as by anal, vaginal, or oral means). A newborn can contract the infection from his or her mother during birth.  RISK FACTORS  Being a woman younger than 30 years of age who is sexually active.  Being a woman 56 years of age or older who has:  A new sex partner.  More than one sex partner.  A sex partner who has a sexually transmitted disease (STD).  Using condoms inconsistently.  Currently having, or having previously had, an STD.  Exchanging sex or money or drugs. SYMPTOMS  Some people with gonorrhea do not have symptoms. Symptoms may be different in females and males.  Females The most common symptoms are:   Pain in the lower abdomen.   Fever with or without chills.  Other symptoms include:   Abnormal vaginal discharge.   Painful intercourse.   Burning or itching of the vagina or lips of the vagina.   Abnormal vaginal bleeding.   Pain when urinating.   Long-lasting (chronic) pain in the lower abdomen, especially during menstruation or intercourse.   Inability to become pregnant.   Going into premature labor.   Irritation, pain, bleeding, or discharge from the rectum. This may occur if the infection was spread by anal sex.   Sore throat or swollen lymph nodes in the neck. This may occur if the infection was spread by oral sex.  Males The most common symptoms are:   Discharge from the penis.   Pain  or burning during urination.   Pain or swelling in the testicles. Other symptoms may include:   Irritation, pain, bleeding, or discharge from the rectum. This may occur if the infection was spread by anal sex.   Sore throat, fever, or swollen lymph nodes in the neck. This may occur if the infection was spread by oral sex.  DIAGNOSIS  A diagnosis is made after a physical exam is done and a sample of discharge is examined under a microscope for the presence of the bacteria. The discharge may be taken from the urethra, cervix, throat, or rectum.  TREATMENT  Gonorrhea is treated with antibiotic medicines. It is important for treatment to begin as soon as possible. Early treatment may prevent some problems from developing. Do not have sex. Avoid all types of sexual activity for 7 days after treatment is complete and until any sex partners have been treated. HOME CARE INSTRUCTIONS   Take medicines only as directed by your health care provider.   Take your antibiotic medicine as directed by your health care provider. Finish the antibiotic even if you start to feel better. Incomplete treatment will put you at risk for continued infection.   Do not have sex until treatment is complete or as directed by your health care provider.   Keep all follow-up visits as directed by your health care provider.   Not all test results are available during your visit. If your test results are not back  during the visit, make an appointment with your health care provider to find out the results. Do not assume everything is normal if you have not heard from your health care provider or the medical facility. It is your responsibility to get your test results.  If you test positive for gonorrhea, inform your recent sexual partners. They need to be checked for gonorrhea even if they do not have symptoms. They may need treatment, even if they test negative for gonorrhea.  SEEK MEDICAL CARE IF:   You develop any  bad reaction to the medicine you were prescribed. This may include:   A rash.   Nausea.   Vomiting.   Diarrhea.   Your symptoms do not improve after a few days of taking antibiotics.   Your symptoms get worse.   You develop increased pain, such as in the testicles (for males) or in the abdomen (for females).  You have a fever. MAKE SURE YOU:   Understand these instructions.  Will watch your condition.  Will get help right away if you are not doing well or get worse.   This information is not intended to replace advice given to you by your health care provider. Make sure you discuss any questions you have with your health care provider.   Document Released: 01/20/2000 Document Revised: 02/12/2014 Document Reviewed: 07/30/2012 Elsevier Interactive Patient Education 2016 [employer] Inc[employer]   Chlamydia, Female Chlamydia is an infection. It is spread through sexual contact. Chlamydia can be in different areas of the body. These areas include the cervix, urethra, throat, or rectum. You may not know you have chlamydia because many people never develop the symptoms. Chlamydia is not difficult to treat once you know you have it. However, if it is left untreated, chlamydia can lead to more serious health problems.  CAUSES  Chlamydia is caused by bacteria. It is a sexually transmitted disease. It is passed from an infected partner during intimate contact. This contact could be with the genitals, mouth, or rectal area. Chlamydia can also be passed from mothers to babies during birth. SIGNS AND SYMPTOMS  There may not be any symptoms. This is often the case early in the infection. If symptoms develop, they may include:  Mild pain and discomfort when urinating.  Redness, soreness, and swelling (inflammation) of the rectum.  Vaginal discharge.  Painful intercourse.  Abdominal pain.  Bleeding between menstrual periods. DIAGNOSIS  To diagnose this infection, your health care  provider will do a pelvic exam. Cultures will be taken of the vagina, cervix, urine, and possibly the rectum to verify the diagnosis.  TREATMENT You will be given antibiotic medicines. If you are pregnant, certain types of antibiotics will need to be avoided. Any sexual partners should also be treated, even if they do not show symptoms. Your health care provider may test you for infection again 3 months after treatment. HOME CARE INSTRUCTIONS   Take your antibiotic medicine as directed by your health care provider. Finish the antibiotic even if you start to feel better.  Take medicines only as directed by your health care provider.  Inform any sexual partners about the infection. They should also be treated.  Do not have sexual contact until your health care provider tells you it is okay.  Get plenty of rest.  Eat a well-balanced diet.  Drink enough fluids to keep your urine clear or pale yellow.  Keep all follow-up visits as directed by your health care provider. SEEK MEDICAL CARE IF:  You have   You have abdominal pain.  You have vaginal discharge.  You have painful sexual intercourse.  You have bleeding between periods and after sex.  You have a fever. SEEK IMMEDIATE MEDICAL CARE IF:   You experience nausea or vomiting.  You experience excessive sweating (diaphoresis).  You have difficulty swallowing.   This information is not intended to replace advice given to you by your health care provider. Make sure you discuss any questions you have with your health care provider.   Document Released: 11/01/2004 Document Revised: 10/13/2014 Document Reviewed: 09/29/2012 Elsevier Interactive Patient Education 2016 [employer] Sexually Transmitted Disease A sexually transmitted disease (STD) is a disease or infection that may be passed (transmitted) from person to person, usually during sexual activity. This may happen by way of saliva, semen, blood, vaginal mucus, or  urine. Common STDs include:  Gonorrhea.  Chlamydia.  Syphilis.  HIV and AIDS.  Genital herpes.  Hepatitis B and C.  Trichomonas.  Human papillomavirus (HPV).  Pubic lice.  Scabies.  Mites.  Bacterial vaginosis. WHAT ARE CAUSES OF STDs? An STD may be caused by bacteria, a virus, or parasites. STDs are often transmitted during sexual activity if one person is infected. However, they may also be transmitted through nonsexual means. STDs may be transmitted after:   Sexual intercourse with an infected person.  Sharing sex toys with an infected person.  Sharing needles with an infected person or using unclean piercing or tattoo needles.  Having intimate contact with the genitals, mouth, or rectal areas of an infected person.  Exposure to infected fluids during birth. WHAT ARE THE SIGNS AND SYMPTOMS OF STDs? Different STDs have different symptoms. Some people may not have any symptoms. If symptoms are present, they may include:  Painful or bloody urination.  Pain in the pelvis, abdomen, vagina, anus, throat, or eyes.  A skin rash, itching, or irritation.  Growths, ulcerations, blisters, or sores in the genital and anal areas.  Abnormal vaginal discharge with or without bad odor.  Penile discharge in men.  Fever.  Pain or bleeding during sexual intercourse.  Swollen glands in the groin area.  Yellow skin and eyes (jaundice). This is seen with hepatitis.  Swollen testicles.  Infertility.  Sores and blisters in the mouth. HOW ARE STDs DIAGNOSED? To make a diagnosis, your health care provider may:  Take a medical history.  Perform a physical exam.  Take a sample of any discharge to examine.  Swab the throat, cervix, opening to the penis, rectum, or vagina for testing.  Test a sample of your first morning urine.  Perform blood tests.  Perform a Pap test, if this applies.  Perform a colposcopy.  Perform a laparoscopy. HOW ARE STDs  TREATED? Treatment depends on the STD. Some STDs may be treated but not cured.  Chlamydia, gonorrhea, trichomonas, and syphilis can be cured with antibiotic medicine.  Genital herpes, hepatitis, and HIV can be treated, but not cured, with prescribed medicines. The medicines lessen symptoms.  Genital warts from HPV can be treated with medicine or by freezing, burning (electrocautery), or surgery. Warts may come back.  HPV cannot be cured with medicine or surgery. However, abnormal areas may be removed from the cervix, vagina, or vulva.  If your diagnosis is confirmed, your recent sexual partners need treatment. This is true even if they are symptom-free or have a negative culture or evaluation. They should not have sex until their health care providers say it is okay.  Your health care provider may  test you for infection again 3 months after treatment. HOW CAN I REDUCE MY RISK OF GETTING AN STD? Take these steps to reduce your risk of getting an STD:  Use latex condoms, dental dams, and water-soluble lubricants during sexual activity. Do not use petroleum jelly or oils.  Avoid having multiple sex partners.  Do not have sex with someone who has other sex partners  Do not have sex with anyone you do not know or who is at high risk for an STD.  Avoid risky sex practices that can break your skin.  Do not have sex if you have open sores on your mouth or skin.  Avoid drinking too much alcohol or taking illegal drugs. Alcohol and drugs can affect your judgment and put you in a vulnerable position.  Avoid engaging in oral and anal sex acts.  Get vaccinated for HPV and hepatitis. If you have not received these vaccines in the past, talk to your health care provider about whether one or both might be right for you.  If you are at risk of being infected with HIV, it is recommended that you take a prescription medicine daily to prevent HIV infection. This is called pre-exposure prophylaxis  (PrEP). You are considered at risk if:  You are a man who has sex with other men (MSM).  You are a heterosexual man or woman and are sexually active with more than one partner.  You take drugs by injection.  You are sexually active with a partner who has HIV.  Talk with your health care provider about whether you are at high risk of being infected with HIV. If you choose to begin PrEP, you should first be tested for HIV. You should then be tested every 3 months for as long as you are taking PrEP. WHAT SHOULD I DO IF I THINK I HAVE AN STD?  See your health care provider.  Tell your sexual partner(s). They should be tested and treated for any STDs.  Do not have sex until your health care provider says it is okay. WHEN SHOULD I GET IMMEDIATE MEDICAL CARE? Contact your health care provider right away if:   You have severe abdominal pain.  You are a man and notice swelling or pain in your testicles.  You are a woman and notice swelling or pain in your vagina.   This information is not intended to replace advice given to you by your health care provider. Make sure you discuss any questions you have with your health care provider.   Document Released: 04/14/2002 Document Revised: 02/12/2014 Document Reviewed: 08/12/2012 Elsevier Interactive Patient Education [employer]

## 2015-09-14 LAB — HIV ANTIBODY (ROUTINE TESTING W REFLEX): HIV SCREEN 4TH GENERATION: NONREACTIVE

## 2015-09-14 LAB — GC/CHLAMYDIA PROBE AMP (~~LOC~~) NOT AT ARMC
CHLAMYDIA, DNA PROBE: NEGATIVE
NEISSERIA GONORRHEA: NEGATIVE

## 2015-09-14 LAB — RPR: RPR: NONREACTIVE

## 2015-09-20 ENCOUNTER — Ambulatory Visit: Payer: Medicaid Other | Admitting: Family

## 2015-12-05 ENCOUNTER — Encounter: Payer: Self-pay | Admitting: Medical

## 2015-12-05 ENCOUNTER — Telehealth: Payer: Self-pay | Admitting: Family Medicine

## 2015-12-05 NOTE — Telephone Encounter (Signed)
Called patient about missed appointment. Left a message about her appointment scheduled, and sent a certified letter.

## 2016-01-09 ENCOUNTER — Encounter: Payer: Medicaid Other | Admitting: Medical

## 2017-02-05 NOTE — L&D Delivery Note (Signed)
Patient admitted today with SROM since 0830 this morning, UC's every 5 mins and cervical dilation 5-6 cm. Expectant management for labor. Patient utilized Fentanyl and nitrous oxide for pain management. She progressed to complete dilation without difficulty.  Delivery Note At 7:51 PM a viable female was delivered via Vaginal, Spontaneous (Presentation: LOA).  APGAR: 8, 9; weight - pending.   Placenta status: spontaneous, intact via Tomasa BlaseSchultz.  Cord: 3VC with no complications.  Cord pH: n/a  Anesthesia: Fentanyl IVP and Nitrous Oxide Episiotomy: None Lacerations: None Suture Repair: n/a Est. Blood Loss (mL): 75  Mom to postpartum.  Baby to Couplet care / Skin to Skin.   Heather Moraolitta Jasmond River, MSN, CNM 09/18/17, 8:07 PM

## 2017-05-04 ENCOUNTER — Encounter (HOSPITAL_COMMUNITY): Payer: Self-pay

## 2017-05-04 ENCOUNTER — Inpatient Hospital Stay (HOSPITAL_COMMUNITY): Payer: Self-pay

## 2017-05-04 ENCOUNTER — Inpatient Hospital Stay (HOSPITAL_COMMUNITY)
Admission: AD | Admit: 2017-05-04 | Discharge: 2017-05-04 | Disposition: A | Discharge status: Home / Self Care | Payer: Self-pay | Source: Ambulatory Visit | Attending: Obstetrics & Gynecology | Admitting: Obstetrics & Gynecology

## 2017-05-04 DIAGNOSIS — O26892 Other specified pregnancy related conditions, second trimester: Secondary | ICD-10-CM | POA: Insufficient documentation

## 2017-05-04 DIAGNOSIS — F17210 Nicotine dependence, cigarettes, uncomplicated: Secondary | ICD-10-CM | POA: Insufficient documentation

## 2017-05-04 DIAGNOSIS — K5900 Constipation, unspecified: Secondary | ICD-10-CM | POA: Insufficient documentation

## 2017-05-04 DIAGNOSIS — O26899 Other specified pregnancy related conditions, unspecified trimester: Secondary | ICD-10-CM

## 2017-05-04 DIAGNOSIS — R109 Unspecified abdominal pain: Secondary | ICD-10-CM | POA: Insufficient documentation

## 2017-05-04 DIAGNOSIS — Z3A20 20 weeks gestation of pregnancy: Secondary | ICD-10-CM | POA: Insufficient documentation

## 2017-05-04 DIAGNOSIS — O99332 Smoking (tobacco) complicating pregnancy, second trimester: Secondary | ICD-10-CM | POA: Insufficient documentation

## 2017-05-04 LAB — CBC WITH DIFFERENTIAL/PLATELET
BASOS PCT: 0 %
Basophils Absolute: 0 10*3/uL (ref 0.0–0.1)
EOS ABS: 0.2 10*3/uL (ref 0.0–0.7)
Eosinophils Relative: 2 %
HCT: 32.2 % — ABNORMAL LOW (ref 36.0–46.0)
HEMOGLOBIN: 10.9 g/dL — AB (ref 12.0–15.0)
Lymphocytes Relative: 25 %
Lymphs Abs: 3 10*3/uL (ref 0.7–4.0)
MCH: 28.5 pg (ref 26.0–34.0)
MCHC: 33.9 g/dL (ref 30.0–36.0)
MCV: 84.3 fL (ref 78.0–100.0)
MONOS PCT: 4 %
Monocytes Absolute: 0.5 10*3/uL (ref 0.1–1.0)
NEUTROS PCT: 69 %
Neutro Abs: 8.6 10*3/uL — ABNORMAL HIGH (ref 1.7–7.7)
Platelets: 211 10*3/uL (ref 150–400)
RBC: 3.82 MIL/uL — ABNORMAL LOW (ref 3.87–5.11)
RDW: 14.6 % (ref 11.5–15.5)
WBC: 12.4 10*3/uL — ABNORMAL HIGH (ref 4.0–10.5)

## 2017-05-04 LAB — URINALYSIS, ROUTINE W REFLEX MICROSCOPIC
Bilirubin Urine: NEGATIVE
Glucose, UA: NEGATIVE mg/dL
HGB URINE DIPSTICK: NEGATIVE
Ketones, ur: NEGATIVE mg/dL
NITRITE: NEGATIVE
Protein, ur: NEGATIVE mg/dL
SPECIFIC GRAVITY, URINE: 1.025 (ref 1.005–1.030)
pH: 6 (ref 5.0–8.0)

## 2017-05-04 LAB — WET PREP, GENITAL
Sperm: NONE SEEN
TRICH WET PREP: NONE SEEN
Yeast Wet Prep HPF POC: NONE SEEN

## 2017-05-04 LAB — DIFFERENTIAL
BASOS ABS: 0 10*3/uL (ref 0.0–0.1)
Basophils Relative: 0 %
Eosinophils Absolute: 0.2 10*3/uL (ref 0.0–0.7)
Eosinophils Relative: 2 %
LYMPHS PCT: 23 %
Lymphs Abs: 3.1 10*3/uL (ref 0.7–4.0)
MONO ABS: 0.5 10*3/uL (ref 0.1–1.0)
Monocytes Relative: 4 %
NEUTROS ABS: 9.7 10*3/uL — AB (ref 1.7–7.7)
Neutrophils Relative %: 71 %

## 2017-05-04 LAB — LIPASE, BLOOD: LIPASE: 23 U/L (ref 11–51)

## 2017-05-04 LAB — AMYLASE: AMYLASE: 44 U/L (ref 28–100)

## 2017-05-04 LAB — COMPREHENSIVE METABOLIC PANEL
ALK PHOS: 67 U/L (ref 38–126)
ALT: 10 U/L — AB (ref 14–54)
AST: 11 U/L — ABNORMAL LOW (ref 15–41)
Albumin: 2.8 g/dL — ABNORMAL LOW (ref 3.5–5.0)
Anion gap: 8 (ref 5–15)
BUN: 14 mg/dL (ref 6–20)
CALCIUM: 8.5 mg/dL — AB (ref 8.9–10.3)
CO2: 21 mmol/L — ABNORMAL LOW (ref 22–32)
CREATININE: 0.45 mg/dL (ref 0.44–1.00)
Chloride: 105 mmol/L (ref 101–111)
GFR calc non Af Amer: >60 mL/min (ref >60)
Glucose, Bld: 82 mg/dL (ref 65–99)
Potassium: 3.7 mmol/L (ref 3.5–5.1)
Sodium: 134 mmol/L — ABNORMAL LOW (ref 135–145)
Total Protein: 6.2 g/dL — ABNORMAL LOW (ref 6.5–8.1)

## 2017-05-04 LAB — POCT PREGNANCY, URINE: Preg Test, Ur: POSITIVE — AB

## 2017-05-04 LAB — HEPATITIS B SURFACE ANTIGEN: HEP B S AG: NEGATIVE
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
## 2017-05-04 NOTE — MAU Note (Signed)
Patient has had irregular periods, LMP 03/22/17 having abdominal bloating, LBM end of February?

## 2017-05-04 NOTE — Discharge Instructions (Signed)

## 2017-05-05 LAB — RUBELLA SCREEN

## 2017-05-05 LAB — HIV ANTIBODY (ROUTINE TESTING W REFLEX): HIV SCREEN 4TH GENERATION: NONREACTIVE

## 2017-05-05 LAB — RPR: RPR: NONREACTIVE

## 2017-05-06 LAB — GC/CHLAMYDIA PROBE AMP (~~LOC~~) NOT AT ARMC
Chlamydia: NEGATIVE
NEISSERIA GONORRHEA: NEGATIVE

## 2017-07-30 ENCOUNTER — Encounter (HOSPITAL_COMMUNITY): Payer: Self-pay | Admitting: *Deleted

## 2017-07-30 ENCOUNTER — Inpatient Hospital Stay (HOSPITAL_COMMUNITY)
Admission: AD | Admit: 2017-07-30 | Discharge: 2017-07-30 | Discharge status: Against Medical Advice | Payer: Medicaid Other | Source: Ambulatory Visit | Attending: Obstetrics and Gynecology | Admitting: Obstetrics and Gynecology

## 2017-07-30 ENCOUNTER — Other Ambulatory Visit: Payer: Self-pay

## 2017-07-30 DIAGNOSIS — Z5321 Procedure and treatment not carried out due to patient leaving prior to being seen by health care provider: Secondary | ICD-10-CM | POA: Diagnosis present

## 2017-07-30 LAB — URINALYSIS, ROUTINE W REFLEX MICROSCOPIC
BILIRUBIN URINE: NEGATIVE
Glucose, UA: NEGATIVE mg/dL
HGB URINE DIPSTICK: NEGATIVE
KETONES UR: NEGATIVE mg/dL
Nitrite: NEGATIVE
PROTEIN: NEGATIVE mg/dL
Specific Gravity, Urine: 1.016 (ref 1.005–1.030)
pH: 6 (ref 5.0–8.0)

## 2017-07-30 LAB — RAPID URINE DRUG SCREEN, HOSP PERFORMED
Amphetamines: NOT DETECTED
Benzodiazepines: NOT DETECTED
Cocaine: NOT DETECTED
Opiates: NOT DETECTED
TETRAHYDROCANNABINOL: POSITIVE — AB

## 2017-07-30 NOTE — Progress Notes (Signed)
Pt sitting up in bed, FHR tracing maternal pulse.

## 2017-07-30 NOTE — MAU Note (Signed)
Woke up in pain up under her stomach and on the side. Is a constant pain. No bleeding or leaking.  Denies GI or GU problems.

## 2017-07-30 NOTE — Progress Notes (Addendum)
Pt removed EFM, states she's leaving secondary she's been here and hasn't been seen.  Left unit AMA and refused to sign AMA form.

## 2017-08-02 ENCOUNTER — Other Ambulatory Visit: Payer: Self-pay

## 2017-08-02 ENCOUNTER — Inpatient Hospital Stay (HOSPITAL_COMMUNITY)
Admission: AD | Admit: 2017-08-02 | Discharge: 2017-08-02 | Disposition: A | Discharge status: Home / Self Care | Payer: Medicaid Other | Source: Ambulatory Visit | Attending: Obstetrics and Gynecology | Admitting: Obstetrics and Gynecology

## 2017-08-02 ENCOUNTER — Encounter (HOSPITAL_COMMUNITY): Payer: Self-pay

## 2017-08-02 DIAGNOSIS — O358XX0 Maternal care for other (suspected) fetal abnormality and damage, not applicable or unspecified: Secondary | ICD-10-CM

## 2017-08-02 DIAGNOSIS — O23593 Infection of other part of genital tract in pregnancy, third trimester: Secondary | ICD-10-CM | POA: Diagnosis not present

## 2017-08-02 DIAGNOSIS — O0933 Supervision of pregnancy with insufficient antenatal care, third trimester: Secondary | ICD-10-CM | POA: Diagnosis not present

## 2017-08-02 DIAGNOSIS — M62838 Other muscle spasm: Secondary | ICD-10-CM | POA: Insufficient documentation

## 2017-08-02 DIAGNOSIS — Z3A33 33 weeks gestation of pregnancy: Secondary | ICD-10-CM | POA: Diagnosis not present

## 2017-08-02 DIAGNOSIS — N760 Acute vaginitis: Secondary | ICD-10-CM | POA: Diagnosis not present

## 2017-08-02 DIAGNOSIS — M545 Low back pain: Secondary | ICD-10-CM | POA: Diagnosis present

## 2017-08-02 DIAGNOSIS — Z3689 Encounter for other specified antenatal screening: Secondary | ICD-10-CM

## 2017-08-02 DIAGNOSIS — R109 Unspecified abdominal pain: Secondary | ICD-10-CM | POA: Diagnosis present

## 2017-08-02 DIAGNOSIS — O26893 Other specified pregnancy related conditions, third trimester: Secondary | ICD-10-CM | POA: Diagnosis present

## 2017-08-02 DIAGNOSIS — O99013 Anemia complicating pregnancy, third trimester: Secondary | ICD-10-CM | POA: Insufficient documentation

## 2017-08-02 DIAGNOSIS — B9689 Other specified bacterial agents as the cause of diseases classified elsewhere: Secondary | ICD-10-CM

## 2017-08-02 DIAGNOSIS — O99333 Smoking (tobacco) complicating pregnancy, third trimester: Secondary | ICD-10-CM | POA: Insufficient documentation

## 2017-08-02 DIAGNOSIS — F17210 Nicotine dependence, cigarettes, uncomplicated: Secondary | ICD-10-CM | POA: Insufficient documentation

## 2017-08-02 DIAGNOSIS — O0993 Supervision of high risk pregnancy, unspecified, third trimester: Secondary | ICD-10-CM

## 2017-08-02 DIAGNOSIS — IMO0002 Reserved for concepts with insufficient information to code with codable children: Secondary | ICD-10-CM

## 2017-08-02 LAB — CBC
HCT: 33.8 % — ABNORMAL LOW (ref 36.0–46.0)
Hemoglobin: 11.4 g/dL — ABNORMAL LOW (ref 12.0–15.0)
MCH: 28.3 pg (ref 26.0–34.0)
MCHC: 33.7 g/dL (ref 30.0–36.0)
MCV: 83.9 fL (ref 78.0–100.0)
PLATELETS: 215 10*3/uL (ref 150–400)
RBC: 4.03 MIL/uL (ref 3.87–5.11)
RDW: 14.7 % (ref 11.5–15.5)
WBC: 9.4 10*3/uL (ref 4.0–10.5)

## 2017-08-02 LAB — URINALYSIS, ROUTINE W REFLEX MICROSCOPIC
BILIRUBIN URINE: NEGATIVE
Glucose, UA: NEGATIVE mg/dL
HGB URINE DIPSTICK: NEGATIVE
Ketones, ur: NEGATIVE mg/dL
NITRITE: NEGATIVE
PROTEIN: NEGATIVE mg/dL
Specific Gravity, Urine: 1.011 (ref 1.005–1.030)
pH: 7 (ref 5.0–8.0)

## 2017-08-02 LAB — WET PREP, GENITAL
Sperm: NONE SEEN
TRICH WET PREP: NONE SEEN
YEAST WET PREP: NONE SEEN

## 2017-08-02 MED ORDER — CYCLOBENZAPRINE HCL 10 MG PO TABS
10.0000 mg | ORAL_TABLET | Freq: Once | ORAL | Status: AC
  Administered 2017-08-02: 10 mg via ORAL
  Filled 2017-08-02: qty 1

## 2017-08-02 MED ORDER — CYCLOBENZAPRINE HCL 10 MG PO TABS: 10.0000 mg | ORAL_TABLET | Freq: Two times a day (BID) | ORAL | 0 refills | Status: DC | PRN

## 2017-08-02 MED ORDER — METRONIDAZOLE 500 MG PO TABS: 500.0000 mg | ORAL_TABLET | Freq: Two times a day (BID) | ORAL | 0 refills | Status: DC

## 2017-08-02 NOTE — Discharge Instructions (Signed)

## 2017-08-02 NOTE — MAU Note (Signed)
'  that pain got worse", it is at the bottom of her stomach and bottom of her back. Constant pain.
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
## 2017-08-03 LAB — CULTURE, OB URINE: Culture: NO GROWTH

## 2017-08-03 LAB — RPR: RPR Ser Ql: NONREACTIVE

## 2017-08-03 LAB — HIV ANTIBODY (ROUTINE TESTING W REFLEX): HIV Screen 4th Generation wRfx: NONREACTIVE

## 2017-08-05 LAB — GC/CHLAMYDIA PROBE AMP (~~LOC~~) NOT AT ARMC
Chlamydia: NEGATIVE
Neisseria Gonorrhea: NEGATIVE

## 2017-08-06 LAB — INFECT DISEASE AB IGM REFLEX 1

## 2017-08-06 LAB — TORCH-IGM(TOXO/ RUB/ CMV/ HSV) W TITER
CMV IgM: <30 AU/mL (ref 0.0–29.9)
HSVI/II Comb IgM: 1.54 Ratio — ABNORMAL HIGH (ref 0.00–0.90)
Toxoplasma Antibody- IgM: <3 AU/mL (ref 0.0–7.9)

## 2017-08-14 ENCOUNTER — Ambulatory Visit (INDEPENDENT_AMBULATORY_CARE_PROVIDER_SITE_OTHER): Payer: Medicaid Other | Admitting: Obstetrics & Gynecology

## 2017-08-14 ENCOUNTER — Encounter: Payer: Self-pay | Admitting: Obstetrics & Gynecology

## 2017-08-14 ENCOUNTER — Other Ambulatory Visit (HOSPITAL_COMMUNITY)
Admission: RE | Admit: 2017-08-14 | Discharge: 2017-08-14 | Disposition: A | Discharge status: Home / Self Care | Payer: Medicaid Other | Source: Ambulatory Visit | Attending: Obstetrics & Gynecology | Admitting: Obstetrics & Gynecology

## 2017-08-14 VITALS — BP 117/65 | HR 94 | Wt 205.6 lb

## 2017-08-14 DIAGNOSIS — O321XX0 Maternal care for breech presentation, not applicable or unspecified: Secondary | ICD-10-CM | POA: Insufficient documentation

## 2017-08-14 DIAGNOSIS — O0933 Supervision of pregnancy with insufficient antenatal care, third trimester: Secondary | ICD-10-CM | POA: Diagnosis not present

## 2017-08-14 DIAGNOSIS — O358XX0 Maternal care for other (suspected) fetal abnormality and damage, not applicable or unspecified: Secondary | ICD-10-CM

## 2017-08-14 DIAGNOSIS — IMO0002 Reserved for concepts with insufficient information to code with codable children: Secondary | ICD-10-CM

## 2017-08-14 DIAGNOSIS — O0940 Supervision of pregnancy with grand multiparity, unspecified trimester: Secondary | ICD-10-CM

## 2017-08-14 DIAGNOSIS — O9989 Other specified diseases and conditions complicating pregnancy, childbirth and the puerperium: Secondary | ICD-10-CM

## 2017-08-14 DIAGNOSIS — O09899 Supervision of other high risk pregnancies, unspecified trimester: Secondary | ICD-10-CM

## 2017-08-14 DIAGNOSIS — Z283 Underimmunization status: Secondary | ICD-10-CM | POA: Diagnosis not present

## 2017-08-14 DIAGNOSIS — R87613 High grade squamous intraepithelial lesion on cytologic smear of cervix (HGSIL): Secondary | ICD-10-CM | POA: Diagnosis not present

## 2017-08-14 DIAGNOSIS — Z3480 Encounter for supervision of other normal pregnancy, unspecified trimester: Secondary | ICD-10-CM | POA: Insufficient documentation

## 2017-08-14 DIAGNOSIS — Z23 Encounter for immunization: Secondary | ICD-10-CM

## 2017-08-14 DIAGNOSIS — O0943 Supervision of pregnancy with grand multiparity, third trimester: Secondary | ICD-10-CM | POA: Diagnosis not present

## 2017-08-14 LAB — POCT URINALYSIS DIP (DEVICE)
BILIRUBIN URINE: NEGATIVE
Glucose, UA: NEGATIVE mg/dL
Hgb urine dipstick: NEGATIVE
Ketones, ur: NEGATIVE mg/dL
Nitrite: NEGATIVE
Protein, ur: 30 mg/dL — AB
SPECIFIC GRAVITY, URINE: 1.02 (ref 1.005–1.030)
UROBILINOGEN UA: 2 mg/dL — AB (ref 0.0–1.0)
pH: 7 (ref 5.0–8.0)
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
## 2017-08-14 NOTE — Progress Notes (Signed)
Here for first ob visit. Declines [facility] screening. Given new patient packet.PMH form done.

## 2017-08-14 NOTE — Patient Instructions (Addendum)
Return to clinic for any scheduled appointments or obstetric concerns, or go to MAU for evaluation   Breech Birth What is a breech birth? A breech birth is when a baby is born with the buttocks or the feet first. Most babies are in a head down (vertex) position when they are born. There are three types of breech babies:  When the baby's buttocks are showing first in the birth canal (vagina) with the legs straight up and the feet at the baby's head (frank breech).  When the baby's buttocks shows first with the legs bent at the knees and the feet down near the buttocks (complete breech).  When one or both of the baby's feet are down below the buttocks (footling breech).  What are the risks of a breech birth? Having a breech birth increases the risk to your baby. A breech birth may cause the following:  Umbilical cord prolapse. This is when the umbilical cord is in front of the baby before or during labor. This can cause the cord to become pinched or compressed. This can reduce the flow of blood and oxygen to the baby.  The baby getting stuck in the birth canal, which can cause injury or, rarely, death.  Injury to the nerves in the shoulder, arm, and hand (brachial plexus injury) when delivered.  Your baby being born too early (prematurely).  An increased need for a cesarean delivery.  What increases the risk of having a breech baby? It is not known what causes your baby to be breech. However, risk factors that may increase your chances of having a breech baby include the following:  The mother having had several babies already.  The mother having twins or more.  The mother having a baby with certain congenital disabilities.  The mother going into labor early.  The mother having problems with her uterus, such as a tumor.  The mother having placenta problems (placenta previa) or too much or not enough fluid surrounding the baby (amniotic fluid).  How do I know if my baby is  breech? There are no symptoms for you to know that your baby is breech. When you are close to your due date, your health care provider can tell if your baby is breech by:  An abdominal or vaginal (pelvic) exam.  An ultrasound.  Your health care provider may also be able to tell that your baby is breech if your baby's heartbeat is heard above your belly button. What can be done if my baby is breech?  Your health care provider may try to turn the baby in your uterus. This is a procedure called external cephalic version (ECV). This is done by your health care provider. He or she will place both hands on your abdomen and gently and slowly turn the baby around. It is important to know that ECV can increase your chances of suddenly going into labor. If an ECV is done, it is done toward the end of a healthy pregnancy. The baby may remain in this position or he or she may turn back to the breech position. You and your health care provider will discuss if an ECV is recommended for you and your baby. How will I delivery my baby if my baby is breech? You and your health care provider will discuss the best way to deliver your baby. If your baby is breech, it is less likely that a vaginal delivery will be recommended due to the risks. Some breech babies may be   delivered safely without a cesarean, while in other cases health care providers will recommend a cesarean delivery. This information is not intended to replace advice given to you by your health care provider. Make sure you discuss any questions you have with your health care provider. Document Released: 03/15/2006 Document Revised: 01/09/2016 Document Reviewed: 11/26/2013 Elsevier Interactive Patient Education  2017 [employer]

## 2017-08-15 DIAGNOSIS — O9989 Other specified diseases and conditions complicating pregnancy, childbirth and the puerperium: Secondary | ICD-10-CM

## 2017-08-15 DIAGNOSIS — Z283 Underimmunization status: Secondary | ICD-10-CM | POA: Insufficient documentation

## 2017-08-15 DIAGNOSIS — Z2839 Other underimmunization status: Secondary | ICD-10-CM | POA: Insufficient documentation

## 2017-08-16 LAB — CERVICOVAGINAL ANCILLARY ONLY
Bacterial vaginitis: NEGATIVE
Candida vaginitis: NEGATIVE
Chlamydia: NEGATIVE
NEISSERIA GONORRHEA: NEGATIVE
Trichomonas: NEGATIVE

## 2017-08-18 LAB — CULTURE, BETA STREP (GROUP B ONLY): STREP GP B CULTURE: NEGATIVE

## 2017-08-19 LAB — CYTOLOGY - PAP
HPV 16/18/45 genotyping: POSITIVE — AB
HPV: DETECTED — AB

## 2017-08-21 ENCOUNTER — Other Ambulatory Visit: Payer: Self-pay

## 2017-08-21 ENCOUNTER — Other Ambulatory Visit: Payer: Self-pay | Admitting: *Deleted

## 2017-08-21 ENCOUNTER — Encounter (HOSPITAL_COMMUNITY): Payer: Self-pay

## 2017-08-21 ENCOUNTER — Inpatient Hospital Stay (HOSPITAL_COMMUNITY)
Admission: AD | Admit: 2017-08-21 | Discharge: 2017-08-21 | Disposition: A | Discharge status: Home / Self Care | Payer: Medicaid Other | Source: Ambulatory Visit | Attending: Obstetrics & Gynecology | Admitting: Obstetrics & Gynecology

## 2017-08-21 DIAGNOSIS — F17210 Nicotine dependence, cigarettes, uncomplicated: Secondary | ICD-10-CM | POA: Diagnosis not present

## 2017-08-21 DIAGNOSIS — O99333 Smoking (tobacco) complicating pregnancy, third trimester: Secondary | ICD-10-CM | POA: Insufficient documentation

## 2017-08-21 DIAGNOSIS — Z8249 Family history of ischemic heart disease and other diseases of the circulatory system: Secondary | ICD-10-CM | POA: Diagnosis not present

## 2017-08-21 DIAGNOSIS — O99891 Other specified diseases and conditions complicating pregnancy: Secondary | ICD-10-CM

## 2017-08-21 DIAGNOSIS — O9989 Other specified diseases and conditions complicating pregnancy, childbirth and the puerperium: Secondary | ICD-10-CM | POA: Diagnosis not present

## 2017-08-21 DIAGNOSIS — Z3A36 36 weeks gestation of pregnancy: Secondary | ICD-10-CM | POA: Diagnosis not present

## 2017-08-21 DIAGNOSIS — Z825 Family history of asthma and other chronic lower respiratory diseases: Secondary | ICD-10-CM | POA: Insufficient documentation

## 2017-08-21 DIAGNOSIS — O0933 Supervision of pregnancy with insufficient antenatal care, third trimester: Secondary | ICD-10-CM

## 2017-08-21 DIAGNOSIS — R109 Unspecified abdominal pain: Secondary | ICD-10-CM | POA: Diagnosis present

## 2017-08-21 DIAGNOSIS — Z3480 Encounter for supervision of other normal pregnancy, unspecified trimester: Secondary | ICD-10-CM

## 2017-08-21 DIAGNOSIS — M549 Dorsalgia, unspecified: Secondary | ICD-10-CM

## 2017-08-21 DIAGNOSIS — O26893 Other specified pregnancy related conditions, third trimester: Secondary | ICD-10-CM | POA: Diagnosis not present

## 2017-08-21 LAB — URINALYSIS, ROUTINE W REFLEX MICROSCOPIC
Bilirubin Urine: NEGATIVE
Glucose, UA: NEGATIVE mg/dL
HGB URINE DIPSTICK: NEGATIVE
Ketones, ur: NEGATIVE mg/dL
Nitrite: NEGATIVE
PROTEIN: NEGATIVE mg/dL
Specific Gravity, Urine: 1.015 (ref 1.005–1.030)
pH: 7 (ref 5.0–8.0)

## 2017-08-21 NOTE — Discharge Instructions (Signed)
You may continue to take Flexeril and Tylenol as needed for back pain.

## 2017-08-21 NOTE — MAU Note (Signed)
Urine sent to lab 
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
## 2017-08-21 NOTE — MAU Note (Signed)
I have communicated with Raelyn Moraolitta Dawson CNM  and reviewed vital signs:  Vitals:   08/21/17 0937 08/21/17 1053  BP: 126/73 113/62  Pulse: 93 90  Resp: 18 20  Temp: 98.4 F (36.9 C)   SpO2: 100%     Vaginal exam:  Dilation: Closed Effacement (%): 50 Cervical Position: Middle Station: Ballotable Presentation: Vertex Exam by:: Carloyn Jaeger. Dawson, CNM,   Also reviewed contraction pattern and that non-stress test is reactive.  It has been documented that patient is not contractin indicating active labor.  Patient denies any other complaints.  Based on this report provider has given order for discharge.  A discharge order and diagnosis entered by a provider.   Labor discharge instructions reviewed with patient.

## 2017-08-21 NOTE — Progress Notes (Signed)
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 NEEDS COLPOSCOPY and biopsies and ECC!!! Can again be deferred until postpartum and patient will be cautioned about concern about cervical cancer!! She did not follow up after last HGSIL pap in 05/02/15; this was also during pregnancy and postpartum colposcopy was recommended.  Please let her know we are worried about possible severe dysplasia/cancer!!! Please call to inform patient of results, recommendations and need for appointment.

## 2017-08-21 NOTE — MAU Note (Signed)
Pt. States lower back pain started yesterday around 12p. Pt states abd. Pain started over a week ago.  Pt. Denies bleeding, but reports some clear dc.  Pt. Reports her underwear were wet twice yesterday, but she hasn't noticed any other leaking since.

## 2017-08-23 ENCOUNTER — Telehealth: Payer: Self-pay | Admitting: General Practice

## 2017-08-23 NOTE — Telephone Encounter (Signed)
-----   Message from Tereso NewcomerUgonna A Anyanwu, MD sent at 08/21/2017 11:35 AM EDT ----- Please schedule patient for colposcopy for HGSIL pap done on 08/14/17. NEEDS COLPOSCOPY and biopsies and ECC!!! Can again be deferred until postpartum and patient will be cautioned about concern about cervical cancer!! She did not follow up after last HGSIL pap in 05/02/15; this was also during pregnancy and postpartum colposcopy was recommended.  Please let her know we are worried about possible severe dysplasia/cancer!!! Please call to inform patient of results, recommendations and need for appointment.

## 2017-08-23 NOTE — Telephone Encounter (Signed)
Called patient multiples times, someone would answer the phone & the hang up. Unable to reach patient. Made note in appt on Monday to discuss with patient when she comes in.

## 2017-08-26 ENCOUNTER — Other Ambulatory Visit: Payer: Medicaid Other

## 2017-08-26 ENCOUNTER — Encounter: Payer: Self-pay | Admitting: Obstetrics & Gynecology

## 2017-08-26 ENCOUNTER — Telehealth: Payer: Self-pay

## 2017-08-26 ENCOUNTER — Encounter: Payer: Medicaid Other | Admitting: Obstetrics & Gynecology

## 2017-08-26 NOTE — Telephone Encounter (Signed)
Called pt in regards to missed OB appt.  Pt states that she did not know about appt today but that she had one tomorrow at 0800.  I asked pt to come down after her appt tomorrow to reschedule her appt if the front office has not called to reschedule.  Pt stated understanding with no further questions.

## 2017-08-27 ENCOUNTER — Other Ambulatory Visit (HOSPITAL_COMMUNITY): Payer: Self-pay | Admitting: Advanced Practice Midwife

## 2017-08-27 ENCOUNTER — Ambulatory Visit (HOSPITAL_COMMUNITY)
Admission: RE | Admit: 2017-08-27 | Discharge: 2017-08-27 | Disposition: A | Discharge status: Home / Self Care | Payer: Medicaid Other | Source: Ambulatory Visit | Attending: Advanced Practice Midwife | Admitting: Advanced Practice Midwife

## 2017-08-27 ENCOUNTER — Encounter (HOSPITAL_COMMUNITY): Payer: Self-pay

## 2017-08-27 DIAGNOSIS — Z3A36 36 weeks gestation of pregnancy: Secondary | ICD-10-CM

## 2017-08-27 DIAGNOSIS — O99333 Smoking (tobacco) complicating pregnancy, third trimester: Secondary | ICD-10-CM

## 2017-08-27 DIAGNOSIS — O0933 Supervision of pregnancy with insufficient antenatal care, third trimester: Secondary | ICD-10-CM

## 2017-08-27 DIAGNOSIS — F1290 Cannabis use, unspecified, uncomplicated: Secondary | ICD-10-CM

## 2017-08-27 DIAGNOSIS — O358XX0 Maternal care for other (suspected) fetal abnormality and damage, not applicable or unspecified: Secondary | ICD-10-CM

## 2017-08-27 DIAGNOSIS — Z362 Encounter for other antenatal screening follow-up: Secondary | ICD-10-CM | POA: Diagnosis present

## 2017-08-27 DIAGNOSIS — O99013 Anemia complicating pregnancy, third trimester: Secondary | ICD-10-CM

## 2017-08-27 DIAGNOSIS — IMO0002 Reserved for concepts with insufficient information to code with codable children: Secondary | ICD-10-CM

## 2017-09-02 ENCOUNTER — Telehealth: Payer: Self-pay | Admitting: Family Medicine

## 2017-09-02 NOTE — Telephone Encounter (Signed)
Called patient to get her scheduled for an appointment. She was not home, but left a message for her to call the office in the morning after 8:00 am.

## 2017-09-03 ENCOUNTER — Inpatient Hospital Stay (HOSPITAL_COMMUNITY)
Admission: AD | Admit: 2017-09-03 | Discharge: 2017-09-03 | Disposition: A | Discharge status: Home / Self Care | Payer: Medicaid Other | Source: Ambulatory Visit | Attending: Family Medicine | Admitting: Family Medicine

## 2017-09-03 ENCOUNTER — Encounter (HOSPITAL_COMMUNITY): Payer: Self-pay

## 2017-09-03 DIAGNOSIS — O99323 Drug use complicating pregnancy, third trimester: Secondary | ICD-10-CM

## 2017-09-03 DIAGNOSIS — N949 Unspecified condition associated with female genital organs and menstrual cycle: Secondary | ICD-10-CM

## 2017-09-03 DIAGNOSIS — O99333 Smoking (tobacco) complicating pregnancy, third trimester: Secondary | ICD-10-CM | POA: Diagnosis not present

## 2017-09-03 DIAGNOSIS — R102 Pelvic and perineal pain: Secondary | ICD-10-CM | POA: Insufficient documentation

## 2017-09-03 DIAGNOSIS — R109 Unspecified abdominal pain: Secondary | ICD-10-CM | POA: Diagnosis present

## 2017-09-03 DIAGNOSIS — F17210 Nicotine dependence, cigarettes, uncomplicated: Secondary | ICD-10-CM | POA: Diagnosis not present

## 2017-09-03 DIAGNOSIS — O26893 Other specified pregnancy related conditions, third trimester: Secondary | ICD-10-CM | POA: Diagnosis not present

## 2017-09-03 DIAGNOSIS — Z3689 Encounter for other specified antenatal screening: Secondary | ICD-10-CM

## 2017-09-03 DIAGNOSIS — Z3A37 37 weeks gestation of pregnancy: Secondary | ICD-10-CM | POA: Insufficient documentation

## 2017-09-03 HISTORY — DX: Drug use complicating pregnancy, third trimester: O99.323

## 2017-09-03 LAB — RAPID URINE DRUG SCREEN, HOSP PERFORMED
AMPHETAMINES: NOT DETECTED
Barbiturates: NOT DETECTED
Benzodiazepines: NOT DETECTED
Cocaine: NOT DETECTED
OPIATES: NOT DETECTED
Tetrahydrocannabinol: NOT DETECTED

## 2017-09-03 LAB — URINALYSIS, ROUTINE W REFLEX MICROSCOPIC
Bilirubin Urine: NEGATIVE
GLUCOSE, UA: NEGATIVE mg/dL
Hgb urine dipstick: NEGATIVE
KETONES UR: NEGATIVE mg/dL
Nitrite: NEGATIVE
PROTEIN: NEGATIVE mg/dL
Specific Gravity, Urine: 1.015 (ref 1.005–1.030)
pH: 6 (ref 5.0–8.0)

## 2017-09-03 LAB — WET PREP, GENITAL
Clue Cells Wet Prep HPF POC: NONE SEEN
SPERM: NONE SEEN
TRICH WET PREP: NONE SEEN
YEAST WET PREP: NONE SEEN

## 2017-09-03 NOTE — Progress Notes (Signed)
CNM at bedside to discuss POC with pt to include d/c instructions.  Pt got dressed & went to restroom.  When informed to return to room for vital signs & sign d/c instructions, pt left the unit.  CNM aware.

## 2017-09-03 NOTE — MAU Note (Signed)
Pt C/O lower abd & pelvic pain that is different from contractions.  Has occasional contraction.  Had some bloody show earlier today, denies frank bleeding or LOF.  Reports good fetal movement.

## 2017-09-03 NOTE — Discharge Instructions (Signed)
Vaginal Delivery Vaginal delivery means that you will give birth by pushing your baby out of your birth canal (vagina). A team of health care providers will help you before, during, and after vaginal delivery. Birth experiences are unique for every woman and every pregnancy, and birth experiences vary depending on where you choose to give birth. What should I do to prepare for my baby's birth? Before your baby is born, it is important to talk with your health care provider about:  Your labor and delivery preferences. These may include: ? Medicines that you may be given. ? How you will manage your pain. This might include non-medical pain relief techniques or injectable pain relief such as epidural analgesia. ? How you and your baby will be monitored during labor and delivery. ? Who may be in the labor and delivery room with you. ? Your feelings about surgical delivery of your baby (cesarean delivery, or C-section) if this becomes necessary. ? Your feelings about receiving donated blood through an IV tube (blood transfusion) if this becomes necessary.  Whether you are able: ? To take pictures or videos of the birth. ? To eat during labor and delivery. ? To move around, walk, or change positions during labor and delivery.  What to expect after your baby is born, such as: ? Whether delayed umbilical cord clamping and cutting is offered. ? Who will care for your baby right after birth. ? Medicines or tests that may be recommended for your baby. ? Whether breastfeeding is supported in your hospital or birth center. ? How long you will be in the hospital or birth center.  How any medical conditions you have may affect your baby or your labor and delivery experience.  To prepare for your baby's birth, you should also:  Attend all of your health care visits before delivery (prenatal visits) as recommended by your health care provider. This is important.  Prepare your home for your baby's  arrival. Make sure that you have: ? Diapers. ? Baby clothing. ? Feeding equipment. ? Safe sleeping arrangements for you and your baby.  Install a car seat in your vehicle. Have your car seat checked by a certified car seat installer to make sure that it is installed safely.  Think about who will help you with your new baby at home for at least the first several weeks after delivery.  What can I expect when I arrive at the birth center or hospital? Once you are in labor and have been admitted into the hospital or birth center, your health care provider may:  Review your pregnancy history and any concerns you have.  Insert an IV tube into one of your veins. This is used to give you fluids and medicines.  Check your blood pressure, pulse, temperature, and heart rate (vital signs).  Check whether your bag of water (amniotic sac) has broken (ruptured).  Talk with you about your birth plan and discuss pain control options.  Monitoring Your health care provider may monitor your contractions (uterine monitoring) and your baby's heart rate (fetal monitoring). You may need to be monitored:  Often, but not continuously (intermittently).  All the time or for long periods at a time (continuously). Continuous monitoring may be needed if: ? You are taking certain medicines, such as medicine to relieve pain or make your contractions stronger. ? You have pregnancy or labor complications.  Monitoring may be done by:  Placing a special stethoscope or a handheld monitoring device on your abdomen to   check your baby's heartbeat, and feeling your abdomen for contractions. This method of monitoring does not continuously record your baby's heartbeat or your contractions.  Placing monitors on your abdomen (external monitors) to record your baby's heartbeat and the frequency and length of contractions. You may not have to wear external monitors all the time.  Placing monitors inside of your uterus  (internal monitors) to record your baby's heartbeat and the frequency, length, and strength of your contractions. ? Your health care provider may use internal monitors if he or she needs more information about the strength of your contractions or your baby's heart rate. ? Internal monitors are put in place by passing a thin, flexible wire through your vagina and into your uterus. Depending on the type of monitor, it may remain in your uterus or on your baby's head until birth. ? Your health care provider will discuss the benefits and risks of internal monitoring with you and will ask for your permission before inserting the monitors.  Telemetry. This is a type of continuous monitoring that can be done with external or internal monitors. Instead of having to stay in bed, you are able to move around during telemetry. Ask your health care provider if telemetry is an option for you.  Physical exam Your health care provider may perform a physical exam. This may include:  Checking whether your baby is positioned: ? With the head toward your vagina (head-down). This is most common. ? With the head toward the top of your uterus (head-up or breech). If your baby is in a breech position, your health care provider may try to turn your baby to a head-down position so you can deliver vaginally. If it does not seem that your baby can be born vaginally, your provider may recommend surgery to deliver your baby. In rare cases, you may be able to deliver vaginally if your baby is head-up (breech delivery). ? Lying sideways (transverse). Babies that are lying sideways cannot be delivered vaginally.  Checking your cervix to determine: ? Whether it is thinning out (effacing). ? Whether it is opening up (dilating). ? How low your baby has moved into your birth canal.  What are the three stages of labor and delivery?  Normal labor and delivery is divided into the following three stages: Stage 1  Stage 1 is the  longest stage of labor, and it can last for hours or days. Stage 1 includes: ? Early labor. This is when contractions may be irregular, or regular and mild. Generally, early labor contractions are more than 10 minutes apart. ? Active labor. This is when contractions get longer, more regular, more frequent, and more intense. ? The transition phase. This is when contractions happen very close together, are very intense, and may last longer than during any other part of labor.  Contractions generally feel mild, infrequent, and irregular at first. They get stronger, more frequent (about every 2-3 minutes), and more regular as you progress from early labor through active labor and transition.  Many women progress through stage 1 naturally, but you may need help to continue making progress. If this happens, your health care provider may talk with you about: ? Rupturing your amniotic sac if it has not ruptured yet. ? Giving you medicine to help make your contractions stronger and more frequent.  Stage 1 ends when your cervix is completely dilated to 4 inches (10 cm) and completely effaced. This happens at the end of the transition phase. Stage 2  Once   your cervix is completely effaced and dilated to 4 inches (10 cm), you may start to feel an urge to push. It is common for the body to naturally take a rest before feeling the urge to push, especially if you received an epidural or certain other pain medicines. This rest period may last for up to 1-2 hours, depending on your unique labor experience.  During stage 2, contractions are generally less painful, because pushing helps relieve contraction pain. Instead of contraction pain, you may feel stretching and burning pain, especially when the widest part of your baby's head passes through the vaginal opening (crowning).  Your health care provider will closely monitor your pushing progress and your baby's progress through the vagina during stage 2.  Your  health care provider may massage the area of skin between your vaginal opening and anus (perineum) or apply warm compresses to your perineum. This helps it stretch as the baby's head starts to crown, which can help prevent perineal tearing. ? In some cases, an incision may be made in your perineum (episiotomy) to allow the baby to pass through the vaginal opening. An episiotomy helps to make the opening of the vagina larger to allow more room for the baby to fit through.  It is very important to breathe and focus so your health care provider can control the delivery of your baby's head. Your health care provider may have you decrease the intensity of your pushing, to help prevent perineal tearing.  After delivery of your baby's head, the shoulders and the rest of the body generally deliver very quickly and without difficulty.  Once your baby is delivered, the umbilical cord may be cut right away, or this may be delayed for 1-2 minutes, depending on your baby's health. This may vary among health care providers, hospitals, and birth centers.  If you and your baby are healthy enough, your baby may be placed on your chest or abdomen to help maintain the baby's temperature and to help you bond with each other. Some mothers and babies start breastfeeding at this time. Your health care team will dry your baby and help keep your baby warm during this time.  Your baby may need immediate care if he or she: ? Showed signs of distress during labor. ? Has a medical condition. ? Was born too early (prematurely). ? Had a bowel movement before birth (meconium). ? Shows signs of difficulty transitioning from being inside the uterus to being outside of the uterus. If you are planning to breastfeed, your health care team will help you begin a feeding. Stage 3  The third stage of labor starts immediately after the birth of your baby and ends after you deliver the placenta. The placenta is an organ that develops  during pregnancy to provide oxygen and nutrients to your baby in the womb.  Delivering the placenta may require some pushing, and you may have mild contractions. Breastfeeding can stimulate contractions to help you deliver the placenta.  After the placenta is delivered, your uterus should tighten (contract) and become firm. This helps to stop bleeding in your uterus. To help your uterus contract and to control bleeding, your health care provider may: ? Give you medicine by injection, through an IV tube, by mouth, or through your rectum (rectally). ? Massage your abdomen or perform a vaginal exam to remove any blood clots that are left in your uterus. ? Empty your bladder by placing a thin, flexible tube (catheter) into your bladder. ? Encourage   you to breastfeed your baby. After labor is over, you and your baby will be monitored closely to ensure that you are both healthy until you are ready to go home. Your health care team will teach you how to care for yourself and your baby. This information is not intended to replace advice given to you by your health care provider. Make sure you discuss any questions you have with your health care provider. Document Released: 11/01/2007 Document Revised: 08/12/2015 Document Reviewed: 02/06/2015 [employer] Interactive Patient Education  2018 [employer]  
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
## 2017-09-04 LAB — CULTURE, OB URINE: Culture: NO GROWTH

## 2017-09-04 LAB — GC/CHLAMYDIA PROBE AMP (~~LOC~~) NOT AT ARMC
Chlamydia: NEGATIVE
Neisseria Gonorrhea: NEGATIVE

## 2017-09-15 ENCOUNTER — Other Ambulatory Visit: Payer: Self-pay

## 2017-09-15 ENCOUNTER — Encounter (HOSPITAL_COMMUNITY): Payer: Self-pay

## 2017-09-15 ENCOUNTER — Inpatient Hospital Stay (HOSPITAL_COMMUNITY)
Admission: AD | Admit: 2017-09-15 | Discharge: 2017-09-15 | Disposition: A | Discharge status: Home Health | Payer: Medicaid Other | Attending: Obstetrics and Gynecology | Admitting: Obstetrics and Gynecology

## 2017-09-15 DIAGNOSIS — O99323 Drug use complicating pregnancy, third trimester: Secondary | ICD-10-CM | POA: Diagnosis not present

## 2017-09-15 DIAGNOSIS — Z3A39 39 weeks gestation of pregnancy: Secondary | ICD-10-CM

## 2017-09-15 DIAGNOSIS — S334XXA Traumatic rupture of symphysis pubis, initial encounter: Secondary | ICD-10-CM | POA: Diagnosis not present

## 2017-09-15 DIAGNOSIS — O26893 Other specified pregnancy related conditions, third trimester: Secondary | ICD-10-CM | POA: Diagnosis present

## 2017-09-15 DIAGNOSIS — R102 Pelvic and perineal pain: Secondary | ICD-10-CM | POA: Insufficient documentation

## 2017-09-15 DIAGNOSIS — F17210 Nicotine dependence, cigarettes, uncomplicated: Secondary | ICD-10-CM | POA: Insufficient documentation

## 2017-09-15 DIAGNOSIS — O99333 Smoking (tobacco) complicating pregnancy, third trimester: Secondary | ICD-10-CM | POA: Diagnosis not present

## 2017-09-15 DIAGNOSIS — O9989 Other specified diseases and conditions complicating pregnancy, childbirth and the puerperium: Secondary | ICD-10-CM | POA: Diagnosis not present

## 2017-09-15 DIAGNOSIS — Z3480 Encounter for supervision of other normal pregnancy, unspecified trimester: Secondary | ICD-10-CM

## 2017-09-15 LAB — URINALYSIS, ROUTINE W REFLEX MICROSCOPIC
Bilirubin Urine: NEGATIVE
GLUCOSE, UA: NEGATIVE mg/dL
HGB URINE DIPSTICK: NEGATIVE
KETONES UR: NEGATIVE mg/dL
NITRITE: NEGATIVE
PROTEIN: NEGATIVE mg/dL
Specific Gravity, Urine: 1.013 (ref 1.005–1.030)
pH: 7 (ref 5.0–8.0)
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
## 2017-09-15 NOTE — MAU Note (Signed)
Pt presents with c/o pelvic pain.  Denies VB or LOF.  Reports +FM, but decreased.  Hasn't eaten this morning, but drank a soda.

## 2017-09-15 NOTE — Discharge Instructions (Signed)
Vaginal delivery means that you will give birth by pushing your baby out of your birth canal (vagina). A team of health care providers will help you before, during, and after vaginal delivery. Birth experiences are unique for every woman and every pregnancy, and birth experiences vary depending on where you choose to give birth. What should I do to prepare for my baby's birth? Before your baby is born, it is important to talk with your health care provider about:  Your labor and delivery preferences. These may include: ? Medicines that you may be given. ? How you will manage your pain. This might include non-medical pain relief techniques or injectable pain relief such as epidural analgesia. ? How you and your baby will be monitored during labor and delivery. ? Who may be in the labor and delivery room with you. ? Your feelings about surgical delivery of your baby (cesarean delivery, or C-section) if this becomes necessary. ? Your feelings about receiving donated blood through an IV tube (blood transfusion) if this becomes necessary.  Whether you are able: ? To take pictures or videos of the birth. ? To eat during labor and delivery. ? To move around, walk, or change positions during labor and delivery.  What to expect after your baby is born, such as: ? Whether delayed umbilical cord clamping and cutting is offered. ? Who will care for your baby right after birth. ? Medicines or tests that may be recommended for your baby. ? Whether breastfeeding is supported in your hospital or birth center. ? How long you will be in the hospital or birth center.  How any medical conditions you have may affect your baby or your labor and delivery experience.  To prepare for your baby's birth, you should also:  Attend all of your health care visits before delivery (prenatal visits) as recommended by your health care provider. This is important.  Prepare your home for your baby's arrival. Make sure  that you have: ? Diapers. ? Baby clothing. ? Feeding equipment. ? Safe sleeping arrangements for you and your baby.  Install a car seat in your vehicle. Have your car seat checked by a certified car seat installer to make sure that it is installed safely.  Think about who will help you with your new baby at home for at least the first several weeks after delivery.  What can I expect when I arrive at the birth center or hospital? Once you are in labor and have been admitted into the hospital or birth center, your health care provider may:  Review your pregnancy history and any concerns you have.  Insert an IV tube into one of your veins. This is used to give you fluids and medicines.  Check your blood pressure, pulse, temperature, and heart rate (vital signs).  Check whether your bag of water (amniotic sac) has broken (ruptured).  Talk with you about your birth plan and discuss pain control options.  Monitoring Your health care provider may monitor your contractions (uterine monitoring) and your baby's heart rate (fetal monitoring). You may need to be monitored:  Often, but not continuously (intermittently).  All the time or for long periods at a time (continuously). Continuous monitoring may be needed if: ? You are taking certain medicines, such as medicine to relieve pain or make your contractions stronger. ? You have pregnancy or labor complications.  Monitoring may be done by:  Placing a special stethoscope or a handheld monitoring device on your abdomen to check your   baby's heartbeat, and feeling your abdomen for contractions. This method of monitoring does not continuously record your baby's heartbeat or your contractions.  Placing monitors on your abdomen (external monitors) to record your baby's heartbeat and the frequency and length of contractions. You may not have to wear external monitors all the time.  Placing monitors inside of your uterus (internal monitors) to  record your baby's heartbeat and the frequency, length, and strength of your contractions. ? Your health care provider may use internal monitors if he or she needs more information about the strength of your contractions or your baby's heart rate. ? Internal monitors are put in place by passing a thin, flexible wire through your vagina and into your uterus. Depending on the type of monitor, it may remain in your uterus or on your baby's head until birth. ? Your health care provider will discuss the benefits and risks of internal monitoring with you and will ask for your permission before inserting the monitors.  Telemetry. This is a type of continuous monitoring that can be done with external or internal monitors. Instead of having to stay in bed, you are able to move around during telemetry. Ask your health care provider if telemetry is an option for you.  Physical exam Your health care provider may perform a physical exam. This may include:  Checking whether your baby is positioned: ? With the head toward your vagina (head-down). This is most common. ? With the head toward the top of your uterus (head-up or breech). If your baby is in a breech position, your health care provider may try to turn your baby to a head-down position so you can deliver vaginally. If it does not seem that your baby can be born vaginally, your provider may recommend surgery to deliver your baby. In rare cases, you may be able to deliver vaginally if your baby is head-up (breech delivery). ? Lying sideways (transverse). Babies that are lying sideways cannot be delivered vaginally.  Checking your cervix to determine: ? Whether it is thinning out (effacing). ? Whether it is opening up (dilating). ? How low your baby has moved into your birth canal.  What are the three stages of labor and delivery?  Normal labor and delivery is divided into the following three stages: Stage 1  Stage 1 is the longest stage of labor,  and it can last for hours or days. Stage 1 includes: ? Early labor. This is when contractions may be irregular, or regular and mild. Generally, early labor contractions are more than 10 minutes apart. ? Active labor. This is when contractions get longer, more regular, more frequent, and more intense. ? The transition phase. This is when contractions happen very close together, are very intense, and may last longer than during any other part of labor.  Contractions generally feel mild, infrequent, and irregular at first. They get stronger, more frequent (about every 2-3 minutes), and more regular as you progress from early labor through active labor and transition.  Many women progress through stage 1 naturally, but you may need help to continue making progress. If this happens, your health care provider may talk with you about: ? Rupturing your amniotic sac if it has not ruptured yet. ? Giving you medicine to help make your contractions stronger and more frequent.  Stage 1 ends when your cervix is completely dilated to 4 inches (10 cm) and completely effaced. This happens at the end of the transition phase. Stage 2  Once your cervix   is completely effaced and dilated to 4 inches (10 cm), you may start to feel an urge to push. It is common for the body to naturally take a rest before feeling the urge to push, especially if you received an epidural or certain other pain medicines. This rest period may last for up to 1-2 hours, depending on your unique labor experience.  During stage 2, contractions are generally less painful, because pushing helps relieve contraction pain. Instead of contraction pain, you may feel stretching and burning pain, especially when the widest part of your baby's head passes through the vaginal opening (crowning).  Your health care provider will closely monitor your pushing progress and your baby's progress through the vagina during stage 2.  Your health care provider may  massage the area of skin between your vaginal opening and anus (perineum) or apply warm compresses to your perineum. This helps it stretch as the baby's head starts to crown, which can help prevent perineal tearing. ? In some cases, an incision may be made in your perineum (episiotomy) to allow the baby to pass through the vaginal opening. An episiotomy helps to make the opening of the vagina larger to allow more room for the baby to fit through.  It is very important to breathe and focus so your health care provider can control the delivery of your baby's head. Your health care provider may have you decrease the intensity of your pushing, to help prevent perineal tearing.  After delivery of your baby's head, the shoulders and the rest of the body generally deliver very quickly and without difficulty.  Once your baby is delivered, the umbilical cord may be cut right away, or this may be delayed for 1-2 minutes, depending on your baby's health. This may vary among health care providers, hospitals, and birth centers.  If you and your baby are healthy enough, your baby may be placed on your chest or abdomen to help maintain the baby's temperature and to help you bond with each other. Some mothers and babies start breastfeeding at this time. Your health care team will dry your baby and help keep your baby warm during this time.  Your baby may need immediate care if he or she: ? Showed signs of distress during labor. ? Has a medical condition. ? Was born too early (prematurely). ? Had a bowel movement before birth (meconium). ? Shows signs of difficulty transitioning from being inside the uterus to being outside of the uterus. If you are planning to breastfeed, your health care team will help you begin a feeding. Stage 3  The third stage of labor starts immediately after the birth of your baby and ends after you deliver the placenta. The placenta is an organ that develops during pregnancy to provide  oxygen and nutrients to your baby in the womb.  Delivering the placenta may require some pushing, and you may have mild contractions. Breastfeeding can stimulate contractions to help you deliver the placenta.  After the placenta is delivered, your uterus should tighten (contract) and become firm. This helps to stop bleeding in your uterus. To help your uterus contract and to control bleeding, your health care provider may: ? Give you medicine by injection, through an IV tube, by mouth, or through your rectum (rectally). ? Massage your abdomen or perform a vaginal exam to remove any blood clots that are left in your uterus. ? Empty your bladder by placing a thin, flexible tube (catheter) into your bladder. ? Encourage you to   breastfeed your baby. After labor is over, you and your baby will be monitored closely to ensure that you are both healthy until you are ready to go home. Your health care team will teach you how to care for yourself and your baby. This information is not intended to replace advice given to you by your health care provider. Make sure you discuss any questions you have with your health care provider. Document Released: 11/01/2007 Document Revised: 08/12/2015 Document Reviewed: 02/06/2015 Elsevier Interactive Patient Education  2018 [employer]  

## 2017-09-15 NOTE — Progress Notes (Addendum)
G10P5 39.[redacted] wksga. Here for pelvic pressure. occas ctx "every now and then". Denies LOF or bleeding. FM "every now and then".   EFM applied by charge nurse. FHR 140, 15x15 accels. UI noted   BP 122/78 (BP Location: Right Arm)   Pulse 97   Temp 97.9 F (36.6 C) (Oral)   Resp 18   Ht 5\' 5"  (1.651 m)   Wt 95.9 kg   LMP 03/22/2017   SpO2 97%   BMI 35.20 kg/m   1237: Provider at bs assessing VE: 4/70/-3  Plan for cervical recheck in an hr but pt does wants to be d/c'd. Offered pain medication but pt declined.   1255: D/c instructions given with pt understanding. Pt left unit via ambulatory with family

## 2017-09-18 ENCOUNTER — Encounter (HOSPITAL_COMMUNITY): Payer: Self-pay

## 2017-09-18 ENCOUNTER — Inpatient Hospital Stay (HOSPITAL_COMMUNITY)
Admission: AD | Admit: 2017-09-18 | Discharge: 2017-09-20 | DRG: 807 | Disposition: A | Discharge status: Home / Self Care | Payer: Medicaid Other | Attending: Obstetrics & Gynecology | Admitting: Obstetrics & Gynecology

## 2017-09-18 DIAGNOSIS — O99334 Smoking (tobacco) complicating childbirth: Secondary | ICD-10-CM | POA: Diagnosis present

## 2017-09-18 DIAGNOSIS — F17210 Nicotine dependence, cigarettes, uncomplicated: Secondary | ICD-10-CM | POA: Diagnosis present

## 2017-09-18 DIAGNOSIS — O99323 Drug use complicating pregnancy, third trimester: Secondary | ICD-10-CM

## 2017-09-18 DIAGNOSIS — Z3480 Encounter for supervision of other normal pregnancy, unspecified trimester: Secondary | ICD-10-CM

## 2017-09-18 DIAGNOSIS — Z3A40 40 weeks gestation of pregnancy: Secondary | ICD-10-CM

## 2017-09-18 DIAGNOSIS — Z3483 Encounter for supervision of other normal pregnancy, third trimester: Secondary | ICD-10-CM | POA: Diagnosis present

## 2017-09-18 LAB — CBC
HEMATOCRIT: 34.2 % — AB (ref 36.0–46.0)
Hemoglobin: 11.6 g/dL — ABNORMAL LOW (ref 12.0–15.0)
MCH: 28.1 pg (ref 26.0–34.0)
MCHC: 33.9 g/dL (ref 30.0–36.0)
MCV: 82.8 fL (ref 78.0–100.0)
Platelets: 192 10*3/uL (ref 150–400)
RBC: 4.13 MIL/uL (ref 3.87–5.11)
RDW: 14.7 % (ref 11.5–15.5)
WBC: 8.9 10*3/uL (ref 4.0–10.5)

## 2017-09-18 LAB — TYPE AND SCREEN
ABO/RH(D): O POS
Antibody Screen: NEGATIVE

## 2017-09-18 LAB — POCT FERN TEST: POCT FERN TEST: POSITIVE

## 2017-09-18 MED ORDER — IBUPROFEN 600 MG PO TABS
600.0000 mg | ORAL_TABLET | Freq: Four times a day (QID) | ORAL | Status: DC
  Administered 2017-09-18 – 2017-09-20 (×6): 600 mg via ORAL
  Filled 2017-09-18 (×7): qty 1

## 2017-09-18 MED ORDER — LIDOCAINE HCL (PF) 1 % IJ SOLN
30.0000 mL | INTRAMUSCULAR | Status: DC | PRN
  Filled 2017-09-18: qty 30

## 2017-09-18 MED ORDER — DIPHENHYDRAMINE HCL 25 MG PO CAPS: 25.0000 mg | ORAL_CAPSULE | Freq: Four times a day (QID) | ORAL | Status: DC | PRN

## 2017-09-18 MED ORDER — LACTATED RINGERS IV SOLN
500.0000 mL | INTRAVENOUS | Status: DC | PRN
  Administered 2017-09-18: 1000 mL via INTRAVENOUS

## 2017-09-18 MED ORDER — PHENYLEPHRINE 40 MCG/ML (10ML) SYRINGE FOR IV PUSH (FOR BLOOD PRESSURE SUPPORT)
80.0000 ug | PREFILLED_SYRINGE | INTRAVENOUS | Status: DC | PRN
  Filled 2017-09-18: qty 5

## 2017-09-18 MED ORDER — BENZOCAINE-MENTHOL 20-0.5 % EX AERO: 1.0000 "application " | INHALATION_SPRAY | CUTANEOUS | Status: DC | PRN

## 2017-09-18 MED ORDER — PRENATAL MULTIVITAMIN CH
1.0000 | ORAL_TABLET | Freq: Every day | ORAL | Status: DC
  Administered 2017-09-19: 1 via ORAL
  Filled 2017-09-18 (×2): qty 1

## 2017-09-18 MED ORDER — EPHEDRINE 5 MG/ML INJ
10.0000 mg | INTRAVENOUS | Status: DC | PRN
  Filled 2017-09-18: qty 2

## 2017-09-18 MED ORDER — OXYTOCIN 40 UNITS IN LACTATED RINGERS INFUSION - SIMPLE MED
2.5000 [IU]/h | INTRAVENOUS | Status: DC
  Administered 2017-09-18 (×2): 2.5 [IU]/h via INTRAVENOUS

## 2017-09-18 MED ORDER — LACTATED RINGERS IV SOLN: 500.0000 mL | Freq: Once | INTRAVENOUS | Status: DC

## 2017-09-18 MED ORDER — ONDANSETRON HCL 4 MG PO TABS: 4.0000 mg | ORAL_TABLET | ORAL | Status: DC | PRN

## 2017-09-18 MED ORDER — OXYTOCIN 40 UNITS IN LACTATED RINGERS INFUSION - SIMPLE MED
1.0000 m[IU]/min | INTRAVENOUS | Status: DC
  Administered 2017-09-18: 2 m[IU]/min via INTRAVENOUS
  Filled 2017-09-18: qty 1000

## 2017-09-18 MED ORDER — TERBUTALINE SULFATE 1 MG/ML IJ SOLN
0.2500 mg | Freq: Once | INTRAMUSCULAR | Status: DC | PRN
  Filled 2017-09-18: qty 1

## 2017-09-18 MED ORDER — SOD CITRATE-CITRIC ACID 500-334 MG/5ML PO SOLN: 30.0000 mL | ORAL | Status: DC | PRN

## 2017-09-18 MED ORDER — OXYCODONE-ACETAMINOPHEN 5-325 MG PO TABS: 1.0000 | ORAL_TABLET | ORAL | Status: DC | PRN

## 2017-09-18 MED ORDER — FENTANYL 2.5 MCG/ML BUPIVACAINE 1/10 % EPIDURAL INFUSION (WH - ANES): 14.0000 mL/h | INTRAMUSCULAR | Status: DC | PRN

## 2017-09-18 MED ORDER — DIPHENHYDRAMINE HCL 50 MG/ML IJ SOLN: 12.5000 mg | INTRAMUSCULAR | Status: DC | PRN

## 2017-09-18 MED ORDER — WITCH HAZEL-GLYCERIN EX PADS: 1.0000 "application " | MEDICATED_PAD | CUTANEOUS | Status: DC | PRN

## 2017-09-18 MED ORDER — BISACODYL 10 MG RE SUPP: 10.0000 mg | Freq: Every day | RECTAL | Status: DC | PRN

## 2017-09-18 MED ORDER — SIMETHICONE 80 MG PO CHEW: 80.0000 mg | CHEWABLE_TABLET | ORAL | Status: DC | PRN

## 2017-09-18 MED ORDER — OXYTOCIN BOLUS FROM INFUSION
500.0000 mL | Freq: Once | INTRAVENOUS | Status: AC
  Administered 2017-09-18: 500 mL via INTRAVENOUS

## 2017-09-18 MED ORDER — SODIUM CHLORIDE 0.9% FLUSH: 3.0000 mL | INTRAVENOUS | Status: DC | PRN

## 2017-09-18 MED ORDER — DIBUCAINE 1 % RE OINT: 1.0000 "application " | TOPICAL_OINTMENT | RECTAL | Status: DC | PRN

## 2017-09-18 MED ORDER — FENTANYL CITRATE (PF) 100 MCG/2ML IJ SOLN
100.0000 ug | INTRAMUSCULAR | Status: DC | PRN
  Administered 2017-09-18: 100 ug via INTRAVENOUS

## 2017-09-18 MED ORDER — FENTANYL CITRATE (PF) 100 MCG/2ML IJ SOLN
INTRAMUSCULAR | Status: AC
  Filled 2017-09-18: qty 2

## 2017-09-18 MED ORDER — ACETAMINOPHEN 325 MG PO TABS
650.0000 mg | ORAL_TABLET | ORAL | Status: DC | PRN
  Administered 2017-09-19 – 2017-09-20 (×3): 650 mg via ORAL
  Filled 2017-09-18 (×3): qty 2

## 2017-09-18 MED ORDER — COCONUT OIL OIL: 1.0000 "application " | TOPICAL_OIL | Status: DC | PRN

## 2017-09-18 MED ORDER — SODIUM CHLORIDE 0.9 % IV SOLN: 250.0000 mL | INTRAVENOUS | Status: DC | PRN

## 2017-09-18 MED ORDER — LACTATED RINGERS IV SOLN
INTRAVENOUS | Status: DC
  Administered 2017-09-18 (×2): via INTRAVENOUS

## 2017-09-18 MED ORDER — ACETAMINOPHEN 325 MG PO TABS
650.0000 mg | ORAL_TABLET | ORAL | Status: DC | PRN
  Administered 2017-09-18: 650 mg via ORAL
  Filled 2017-09-18: qty 2

## 2017-09-18 MED ORDER — SODIUM CHLORIDE 0.9% FLUSH: 3.0000 mL | Freq: Two times a day (BID) | INTRAVENOUS | Status: DC

## 2017-09-18 MED ORDER — ONDANSETRON HCL 4 MG/2ML IJ SOLN: 4.0000 mg | INTRAMUSCULAR | Status: DC | PRN

## 2017-09-18 MED ORDER — OXYCODONE-ACETAMINOPHEN 5-325 MG PO TABS: 2.0000 | ORAL_TABLET | ORAL | Status: DC | PRN

## 2017-09-18 MED ORDER — FLEET ENEMA 7-19 GM/118ML RE ENEM: 1.0000 | ENEMA | Freq: Every day | RECTAL | Status: DC | PRN

## 2017-09-18 MED ORDER — ONDANSETRON HCL 4 MG/2ML IJ SOLN: 4.0000 mg | Freq: Four times a day (QID) | INTRAMUSCULAR | Status: DC | PRN

## 2017-09-18 MED ORDER — SENNOSIDES-DOCUSATE SODIUM 8.6-50 MG PO TABS
2.0000 | ORAL_TABLET | ORAL | Status: DC
  Administered 2017-09-18: 2 via ORAL
  Filled 2017-09-18 (×2): qty 2

## 2017-09-18 NOTE — MAU Note (Signed)
Pt thinks her water broke around 0800. Starting to have ctx.

## 2017-09-18 NOTE — Anesthesia Pain Management Evaluation Note (Signed)
  CRNA Pain Management Visit Note  Patient: Heather Good, 32 y.o., female  "Hello I am a member of the anesthesia team at [facility]. We have an anesthesia team available at all times to provide care throughout the hospital, including epidural management and anesthesia for C-section. I don't know your plan for the delivery whether it a natural birth, water birth, IV sedation, nitrous supplementation, doula or epidural, but we want to meet your pain goals."   1.Was your pain managed to your expectations on prior hospitalizations?   Yes   2.What is your expectation for pain management during this hospitalization?     Epidural, IV pain meds and Nitrous Oxide  3.How can we help you reach that goal? Be available, Patients wants to try delivery with IV pain meds.   Record the patient's initial score and the patient's pain goal.   Pain: 6  Pain Goal: 6 [facility] wants you to be able to say your pain was always managed very well.  [facility] Center[facility],Johnathan Tortorelli 09/18/2017
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
## 2017-09-19 LAB — RPR: RPR: NONREACTIVE

## 2017-09-19 MED ORDER — MEASLES, MUMPS & RUBELLA VAC ~~LOC~~ INJ: 0.5000 mL | INJECTION | Freq: Once | SUBCUTANEOUS | Status: DC

## 2017-09-19 NOTE — Clinical Social Work Maternal (Signed)
CLINICAL SOCIAL WORK MATERNAL/CHILD NOTE  Patient Details  Name: Heather Good MRN: [id number] Date of Birth: 1985-08-21  Date:  09/19/2017  Clinical Social Worker Initiating Note:  Laurey Arrow Date/Time: Initiated:  09/19/17/1419     Child's Name:  Heather Good   Biological Parents:  Mother, Father(FOB is Thornell Mule 09/21/73)   Need for Interpreter:  None   Reason for Referral:  Late or No Prenatal Care    Address:  [place] [address]    Phone number:  [phone] (home)     Additional phone number:   Household Members/Support Persons (HM/SP):   Household Member/Support Person 1, Household Member/Support Person 2(MOB has 6 children 4 girls (ages 32, 57, and 57) and 2 boys  (ages 77 and 79).)   HM/SP Name Relationship DOB or Age  HM/SP -1 Maura Crandall son 07/10/2015  HM/SP -2 Marthenia Rolling  son 02/21/2012  HM/SP -3        HM/SP -4        HM/SP -5        HM/SP -6        HM/SP -7        HM/SP -8          Natural Supports (not living in the home):      Professional Supports: None   Employment: Unemployed   Type of Work:     Education:  [place] arranged:    Museum/gallery curator Resources:  [employer]   Other Resources:  [employer], Physicist, medical    Cultural/Religious Considerations Which May Impact Care:  Per [employer] Sheet, MOB is Non-Denominational  Strengths:  Ability to meet basic needs , Home prepared for child , Pediatrician chosen   Psychotropic Medications:         Pediatrician:    [place] area  Pediatrician List:   [facility] ([address])  [place]      Pediatrician Fax Number:    Risk Factors/Current Problems:  Substance Use    Cognitive State:  Able to Concentrate , Alert , Linear Thinking    Mood/Affect:  Calm , Interested , Comfortable ,  Relaxed    CSW Assessment: CSW met with MOB in room 118 to complete an assessment for late [facility]. When CSW arrived, MOB had several room guest that MOB recognized as MOB's daughters.  With MOB's permission, CSW asked MOB's daughter to step out of the room in order to meet with MOB in private. MOB was polite, forthcoming, and receptive to meeting with CSW.   CSW asked about MOB's late/limited PNC. MOB shared, "I didn't have a reason to go to the doctor so I didn't go.  I only go to the doctor if I feel like something in wrong."  CSW assessed for barriers for follow-up for infant and MOB denied barriers.   CSW informed MOB of the hospital's drug screen policy regarding limited/late PNC. MOB was made aware of the 2 drug screenings for the infant.  MOB was understanding and did not have any concerns. MOB communicated that MOB was familiar with the process from MOB's last baby. MOB reported that MOB does not currently have an open case. CSW shared with MOB that CSW will monitor infant's UDS and CDS and make a report to [facility] if warranted.  MOB  openly shared that MOB smoke marijuana last in December 2018. CSW offered MOB resources and referrals for substance interventions and MOB declined.  MOB communicated having good support team and feeling prepared to parent.   CSW Plan/Description:  No Further Intervention Required/No Barriers to Discharge, Sudden Infant Death Syndrome (SIDS) Education, Perinatal Mood and Anxiety Disorder (PMADs) Education, Other Patient/Family Education, Other Information/Referral to [employer], CSW Will Continue to Monitor Umbilical Cord Tissue Drug Screen Results and Make Report if Warranted   Laurey Arrow, MSW, LCSW Clinical Social Work [phone]   Dimple Nanas, LCSW 09/19/2017, 2:25 PM

## 2017-09-19 NOTE — Progress Notes (Signed)
CSW acknowledges consult.  CSW attempted to meet with MOB, however [facility]. CSW will attempt to meet with MOB later.  Blaine HamperAngel Boyd-Gilyard, MSW, LCSW Clinical Social Work [phone]

## 2017-09-19 NOTE — Progress Notes (Signed)
Patient ID: Heather Good, female   DOB: September 08, 1985, 32 y.o.   MRN: [id number]  POSTPARTUM PROGRESS NOTE  Post partum Day 1 Subjective:  Heather Good is a 32 y.o. [id number] 436w0d s/p SVD.  No acute events overnight.  Pt denies problems with ambulating, voiding or po intake.  She denies nausea or vomiting.  Pain is well controlled.  She has had flatus. She has not had bowel movement.  Lochia Small.   Objective: Blood pressure (!) 104/56, pulse 72, temperature 98.8 F (37.1 C), temperature source Oral, resp. rate 18, weight 95.3 kg, last menstrual period 03/22/2017, SpO2 100 %, unknown if currently breastfeeding.  Physical Exam:  General: alert, cooperative and no distress Chest: no respiratory distress Heart:regular rate, distal pulses intact Abdomen: soft, nontender,  Uterine Fundus: firm, appropriately tender DVT Evaluation: No calf swelling or tenderness Extremities: no edema  Recent Labs    09/18/17 1003  HGB 11.6*  HCT 34.2*    Assessment/Plan:  ASSESSMENT: Heather Good is a 32 y.o. [id number] 7736w0d s/p SVD.   Plan for discharge tomorrow and Contraception depo   LOS: 1 day   Jackelyn KnifeDaniel K OlsonMD 09/19/2017, 7:38 AM

## 2017-09-20 ENCOUNTER — Other Ambulatory Visit: Payer: Self-pay

## 2017-09-20 MED ORDER — PRENATAL MULTIVITAMIN CH: 1.0000 | ORAL_TABLET | Freq: Every day | ORAL | 0 refills | Status: DC

## 2017-09-20 NOTE — Discharge Summary (Signed)
Obstetrical Discharge Summary  Date of Admission: 09/18/2017 Date of Discharge: 09/20/2017  Primary OB: [facility]  Gestational Age at Delivery: 6227w0d   Antepartum complications: Patient only came to one prenatal visit (at 35wks). HSIL pap smear. History of recent THC use (07/30/17) Reason for Admission: SROM Date of Delivery: 09/18/2017  Delivered By: Raelyn Moraolitta Dawson, CNM Delivery Type: spontaneous vaginal delivery Intrapartum complications/course: None Anesthesia: IV medications, Nitrous Oxide Placenta: Delivered and expressed via active management. Intact: yes. To pathology: no.  Laceration: none Episiotomy: none EBL: 75mL Baby: Liveborn female, APGARs 8/9, weight 3334 g.    Discharge Diagnosis: Delivered  Postpartum course: Uncomplicated. UDS negative on admission. Seen by SW and no barriers to discharge noted.   Discharge Vital Signs:  Current Vital Signs 24h Vital Sign Ranges  T 98.1 F (36.7 C) Temp  Avg: 98.3 F (36.8 C)  Min: 98.1 F (36.7 C)  Max: 98.7 F (37.1 C)  BP 118/70 BP  Min: 109/69  Max: 124/74  HR 60 Pulse  Avg: 63.7  Min: 60  Max: 66  RR 18 Resp  Avg: 17.7  Min: 17  Max: 18  SaO2 99 % (Room Air) SpO2  Avg: 99.5 %  Min: 99 %  Max: 100 %       24 Hour I/O Current Shift I/O  Time Ins Outs No intake/output data recorded. No intake/output data recorded.   Discharge Exam:  NAD Perineum: deferred Abdomen: firm fundus below the umbilicus, NTTP, non distended, +bowel sounds.  RRR no MRGs CTAB Ext: no c/c/e  Recent Labs  Lab 09/18/17 1003  WBC 8.9  HGB 11.6*  HCT 34.2*  PLT 192    Disposition: Home  Rh Immune globulin given: not applicable Rubella vaccine given: not given prior to discharge Tdap vaccine given in AP or PP setting: Yes Flu vaccine given in AP or PP setting: not applicable  Contraception: Depo Provera (not given while inpatient) to BTL   Prenatal/Postnatal Panel: O POS//Rubella Not immune//Varicella  Unknown//RPR negative//HIV negative/HepB Surface Ag negative//pap HSIL (date: 08/2017)//formula  Plan:  Clarice Poleanneisha R Ellingson was discharged to home in good condition. Follow-up appointment with [facility] in 4 weeks for a PP visit (request sent to clinic)  Patient will also need a colposcopy  Discharge Medications: Allergies as of 09/20/2017   No Known Allergies     Medication List    TAKE these medications   prenatal multivitamin Tabs tablet Take 1 tablet by mouth daily at 12 noon.       Cornelia Copaharlie Danessa Mensch, Jr. MD Attending Center for [facility](Faculty Practice)

## 2017-10-23 ENCOUNTER — Ambulatory Visit (INDEPENDENT_AMBULATORY_CARE_PROVIDER_SITE_OTHER): Payer: Medicaid Other | Admitting: Obstetrics & Gynecology

## 2017-10-23 ENCOUNTER — Encounter: Payer: Self-pay | Admitting: Obstetrics & Gynecology

## 2017-10-23 ENCOUNTER — Other Ambulatory Visit (HOSPITAL_COMMUNITY)
Admission: RE | Admit: 2017-10-23 | Discharge: 2017-10-23 | Disposition: A | Discharge status: Home / Self Care | Payer: Medicaid Other | Source: Ambulatory Visit | Attending: Obstetrics & Gynecology | Admitting: Obstetrics & Gynecology

## 2017-10-23 ENCOUNTER — Encounter: Payer: Self-pay | Admitting: *Deleted

## 2017-10-23 DIAGNOSIS — Z3202 Encounter for pregnancy test, result negative: Secondary | ICD-10-CM | POA: Diagnosis not present

## 2017-10-23 DIAGNOSIS — D069 Carcinoma in situ of cervix, unspecified: Secondary | ICD-10-CM

## 2017-10-23 DIAGNOSIS — Z3042 Encounter for surveillance of injectable contraceptive: Secondary | ICD-10-CM | POA: Diagnosis not present

## 2017-10-23 DIAGNOSIS — Z1389 Encounter for screening for other disorder: Secondary | ICD-10-CM

## 2017-10-23 LAB — POCT PREGNANCY, URINE: Preg Test, Ur: NEGATIVE

## 2017-10-23 MED ORDER — MEDROXYPROGESTERONE ACETATE 150 MG/ML IM SUSP
150.0000 mg | Freq: Once | INTRAMUSCULAR | Status: AC
  Administered 2017-10-23: 150 mg via INTRAMUSCULAR

## 2017-10-23 NOTE — Progress Notes (Signed)
Subjective:     Heather Good is a 32 y.o. female who presents for a postpartum visit. She is 5 weeks postpartum following a spontaneous vaginal delivery. I have fully reviewed the prenatal and intrapartum course. The delivery was at 3885w0d gestational weeks. Outcome: spontaneous vaginal delivery. Anesthesia: nitrous. Postpartum course has been uncomplicated. Baby's course has been unremarkable. Baby is feeding by bottle - [employer] Good Start. Bleeding no bleeding. Bowel function is normal. Bladder function is normal. Patient is not sexually active. Contraception method is Depo-Provera injections. Postpartum depression screening: negative.  The following portions of the patient's history were reviewed and updated as appropriate: allergies, current medications, past family history, past medical history, past social history, past surgical history and problem list.  Review of Systems Pertinent items are noted in HPI.   Objective:    There were no vitals taken for this visit.  General:  alert   Breasts:  inspection negative, no nipple discharge or bleeding, no masses or nodularity palpable  Lungs: clear to auscultation bilaterally  Heart:  regular rate and rhythm, S1, S2 normal, no murmur, click, rub or gallop  Abdomen: soft, non-tender; bowel sounds normal; no masses,  no organomegaly   Vulva:  normal  Vagina: normal vagina  Cervix:  anteverted  Corpus: normal  Adnexa:  normal adnexa  Rectal Exam: Not performed.         UPT negative, consent signed, time out done Cervix prepped with acetic acid. Transformation zone seen in its entirety. Colpo adequate. Dense acetowhite changes seen in a 1 cm circular fashion at the 12 o'clock position. I biopsied this. Silver nitrate was used for hemostasis. ECC obtained. She tolerated the procedure well.   Assessment:     Normal postpartum exam. Pap smear not done at today's visit.   Plan:    1. Contraception: Depo-Provera injections 2. Colpo  done today 3. Follow up in: 4 weeks for treatment plan for cervical dysplasia or as needed.

## 2017-12-11 ENCOUNTER — Telehealth: Payer: Self-pay

## 2017-12-11 ENCOUNTER — Ambulatory Visit: Payer: Medicaid Other | Admitting: Obstetrics & Gynecology

## 2017-12-11 NOTE — Progress Notes (Deleted)
   Patient did not show up today for her scheduled appointment.  She has CIN III and need a LEEP. She will be called and asked to come in for discussion of results and advised to get a LEEP.   Heather Collins, MD, FACOG Obstetrician & Gynecologist, [facility] for [employer], [facility]

## 2017-12-11 NOTE — Telephone Encounter (Signed)
Per Provider Dr. Macon Large Instructions to call pt to let her know that she needs to be seen asap to discuss pathology. Called pt no answer & mail box is full. Please call back to re-schedule.

## 2017-12-12 ENCOUNTER — Encounter: Payer: Self-pay | Admitting: Obstetrics & Gynecology

## 2017-12-12 ENCOUNTER — Telehealth: Payer: Self-pay | Admitting: Obstetrics & Gynecology

## 2017-12-12 NOTE — Telephone Encounter (Signed)
Called patient to get her rescheduled to come and speak with the provider. When I called number listed in Epic, phone was answered, then hung up. I will be sending a letter.

## 2018-01-07 ENCOUNTER — Encounter: Payer: Self-pay | Admitting: Obstetrics & Gynecology

## 2018-07-02 ENCOUNTER — Encounter: Payer: Self-pay | Admitting: *Deleted

## 2018-07-16 ENCOUNTER — Encounter: Payer: Self-pay | Admitting: *Deleted

## 2018-11-26 ENCOUNTER — Encounter (HOSPITAL_COMMUNITY): Payer: Self-pay

## 2018-11-26 ENCOUNTER — Emergency Department (HOSPITAL_COMMUNITY)
Admission: EM | Admit: 2018-11-26 | Discharge: 2018-11-27 | Disposition: A | Discharge status: Home / Self Care | Payer: Medicaid Other | Attending: Emergency Medicine | Admitting: Emergency Medicine

## 2018-11-26 ENCOUNTER — Other Ambulatory Visit: Payer: Self-pay

## 2018-11-26 DIAGNOSIS — F17210 Nicotine dependence, cigarettes, uncomplicated: Secondary | ICD-10-CM | POA: Insufficient documentation

## 2018-11-26 DIAGNOSIS — R519 Headache, unspecified: Secondary | ICD-10-CM | POA: Diagnosis not present

## 2018-11-26 DIAGNOSIS — R220 Localized swelling, mass and lump, head: Secondary | ICD-10-CM | POA: Diagnosis present

## 2018-11-26 DIAGNOSIS — H9201 Otalgia, right ear: Secondary | ICD-10-CM | POA: Insufficient documentation

## 2018-11-26 DIAGNOSIS — L0201 Cutaneous abscess of face: Secondary | ICD-10-CM | POA: Diagnosis not present

## 2018-11-26 LAB — I-STAT BETA HCG BLOOD, ED (MC, WL, AP ONLY): I-stat hCG, quantitative: <5 m[IU]/mL (ref <5)

## 2018-11-26 MED ORDER — MORPHINE SULFATE (PF) 4 MG/ML IV SOLN
4.0000 mg | Freq: Once | INTRAVENOUS | Status: AC
  Administered 2018-11-27: 4 mg via INTRAVENOUS
  Filled 2018-11-26: qty 1

## 2018-11-26 MED ORDER — HYDROCODONE-ACETAMINOPHEN 5-325 MG PO TABS
1.0000 | ORAL_TABLET | Freq: Once | ORAL | Status: AC
  Administered 2018-11-26: 1 via ORAL
  Filled 2018-11-26: qty 1

## 2018-11-26 NOTE — ED Triage Notes (Signed)
Pt complains of an abscess on the right side of her jaw since Friday
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
## 2018-11-26 NOTE — ED Notes (Signed)
Right cheek appears swollen, red, and warm to touch. Patient states that it happened a few days ago and has gotten progressively worse. Patient states that the swelling has made it difficult to speak and eat. Also endorses right ear pain and headache which she attributes to her swelling. She has a history of abscesses but they are usually under her arms.

## 2018-11-27 ENCOUNTER — Encounter (HOSPITAL_COMMUNITY): Payer: Self-pay

## 2018-11-27 ENCOUNTER — Emergency Department (HOSPITAL_COMMUNITY): Payer: Medicaid Other

## 2018-11-27 LAB — CBC WITH DIFFERENTIAL/PLATELET
Abs Immature Granulocytes: 0.06 10*3/uL (ref 0.00–0.07)
Basophils Absolute: 0.1 10*3/uL (ref 0.0–0.1)
Basophils Relative: 1 %
Eosinophils Absolute: 0.2 10*3/uL (ref 0.0–0.5)
Eosinophils Relative: 2 %
HCT: 37.3 % (ref 36.0–46.0)
Hemoglobin: 12.4 g/dL (ref 12.0–15.0)
Immature Granulocytes: 0 %
Lymphocytes Relative: 22 %
Lymphs Abs: 3.5 10*3/uL (ref 0.7–4.0)
MCH: 28.6 pg (ref 26.0–34.0)
MCHC: 33.2 g/dL (ref 30.0–36.0)
MCV: 86.1 fL (ref 80.0–100.0)
Monocytes Absolute: 1.4 10*3/uL — ABNORMAL HIGH (ref 0.1–1.0)
Monocytes Relative: 8 %
Neutro Abs: 10.8 10*3/uL — ABNORMAL HIGH (ref 1.7–7.7)
Neutrophils Relative %: 67 %
Platelets: 207 10*3/uL (ref 150–400)
RBC: 4.33 MIL/uL (ref 3.87–5.11)
RDW: 15.2 % (ref 11.5–15.5)
WBC: 16.1 10*3/uL — ABNORMAL HIGH (ref 4.0–10.5)
nRBC: 0 % (ref 0.0–0.2)

## 2018-11-27 LAB — BASIC METABOLIC PANEL
Anion gap: 7 (ref 5–15)
BUN: 17 mg/dL (ref 6–20)
CO2: 21 mmol/L — ABNORMAL LOW (ref 22–32)
Calcium: 8.8 mg/dL — ABNORMAL LOW (ref 8.9–10.3)
Chloride: 108 mmol/L (ref 98–111)
Creatinine, Ser: 0.62 mg/dL (ref 0.44–1.00)
GFR calc Af Amer: >60 mL/min (ref >60)
GFR calc non Af Amer: >60 mL/min (ref >60)
Glucose, Bld: 96 mg/dL (ref 70–99)
Potassium: 4.1 mmol/L (ref 3.5–5.1)
Sodium: 136 mmol/L (ref 135–145)

## 2018-11-27 MED ORDER — CLINDAMYCIN HCL 300 MG PO CAPS: 300.0000 mg | ORAL_CAPSULE | Freq: Three times a day (TID) | ORAL | 0 refills | Status: AC

## 2018-11-27 MED ORDER — IOHEXOL 300 MG/ML  SOLN: 75.0000 mL | Freq: Once | INTRAMUSCULAR | Status: DC | PRN

## 2018-11-27 MED ORDER — SODIUM CHLORIDE (PF) 0.9 % IJ SOLN
INTRAMUSCULAR | Status: AC
  Filled 2018-11-27: qty 50

## 2018-11-27 MED ORDER — CLINDAMYCIN PHOSPHATE 600 MG/50ML IV SOLN
600.0000 mg | Freq: Once | INTRAVENOUS | Status: AC
  Administered 2018-11-27: 02:00:00 600 mg via INTRAVENOUS
  Filled 2018-11-27: qty 50

## 2018-11-27 MED ORDER — HYDROMORPHONE HCL 1 MG/ML IJ SOLN: 0.5000 mg | Freq: Once | INTRAMUSCULAR | Status: DC

## 2018-11-27 MED ORDER — HYDROCODONE-ACETAMINOPHEN 5-325 MG PO TABS: 1.0000 | ORAL_TABLET | ORAL | 0 refills | Status: DC | PRN

## 2018-11-27 NOTE — Discharge Instructions (Signed)
Call oral surgeon tomorrow morning to have your abscess drained.  Take antibiotics as prescribed. Return if you have new or worsening symptoms

## 2018-11-27 NOTE — ED Notes (Signed)
Patient removed IV and all monitoring devices, stating "I knew I shouldn't have come here." Discharge instructions and follow up care reviewed with patient. Patient alert and oriented x4 and stable at time of discharge. Patient discharged with family.

## 2019-02-26 ENCOUNTER — Other Ambulatory Visit: Payer: Self-pay

## 2019-02-26 ENCOUNTER — Encounter (HOSPITAL_COMMUNITY): Payer: Self-pay | Admitting: Obstetrics & Gynecology

## 2019-02-26 ENCOUNTER — Inpatient Hospital Stay (HOSPITAL_COMMUNITY): Payer: Medicaid Other

## 2019-02-26 ENCOUNTER — Inpatient Hospital Stay (HOSPITAL_COMMUNITY)
Admission: AD | Admit: 2019-02-26 | Discharge: 2019-02-26 | Disposition: A | Discharge status: Home / Self Care | Payer: Medicaid Other | Attending: Obstetrics & Gynecology | Admitting: Obstetrics & Gynecology

## 2019-02-26 DIAGNOSIS — F17210 Nicotine dependence, cigarettes, uncomplicated: Secondary | ICD-10-CM | POA: Insufficient documentation

## 2019-02-26 DIAGNOSIS — O200 Threatened abortion: Secondary | ICD-10-CM | POA: Diagnosis not present

## 2019-02-26 DIAGNOSIS — O209 Hemorrhage in early pregnancy, unspecified: Secondary | ICD-10-CM

## 2019-02-26 DIAGNOSIS — O26891 Other specified pregnancy related conditions, first trimester: Secondary | ICD-10-CM | POA: Diagnosis not present

## 2019-02-26 DIAGNOSIS — R109 Unspecified abdominal pain: Secondary | ICD-10-CM | POA: Diagnosis not present

## 2019-02-26 DIAGNOSIS — Z3491 Encounter for supervision of normal pregnancy, unspecified, first trimester: Secondary | ICD-10-CM

## 2019-02-26 DIAGNOSIS — O99331 Smoking (tobacco) complicating pregnancy, first trimester: Secondary | ICD-10-CM | POA: Insufficient documentation

## 2019-02-26 DIAGNOSIS — Z3A11 11 weeks gestation of pregnancy: Secondary | ICD-10-CM

## 2019-02-26 LAB — WET PREP, GENITAL
Sperm: NONE SEEN
Trich, Wet Prep: NONE SEEN
Yeast Wet Prep HPF POC: NONE SEEN

## 2019-02-26 LAB — URINALYSIS, ROUTINE W REFLEX MICROSCOPIC
Bilirubin Urine: NEGATIVE
Glucose, UA: NEGATIVE mg/dL
Ketones, ur: NEGATIVE mg/dL
Leukocytes,Ua: NEGATIVE
Nitrite: NEGATIVE
Protein, ur: 100 mg/dL — AB
Specific Gravity, Urine: 1.028 (ref 1.005–1.030)
pH: 5 (ref 5.0–8.0)

## 2019-02-26 LAB — POCT PREGNANCY, URINE: Preg Test, Ur: POSITIVE — AB

## 2019-02-26 NOTE — MAU Note (Addendum)
Was at work and started having vag bleeding. Used 3pads today but just spotting for last hour.  Started having abd pain tonight. LMP 12/05/2018. Was seen at [facility] ED in [place] 2wks ago and told was pregnant and should seek [facility]. Went to ED then for headaches

## 2019-02-26 NOTE — MAU Provider Note (Signed)
Chief Complaint: Abdominal Pain and Vaginal Bleeding   First Provider Initiated Contact with Patient 02/26/19 2026     SUBJECTIVE HPI: Heather Good is a 34 y.o. [id number] at [redacted]w[redacted]d who presents to Maternity Admissions reporting abdominal cramping & vaginal bleeding. Had a positive pregnancy test at a [facility] a month ago. Has not been seen anywhere else & has not had any imaging done.  Symptoms started today. Reports bleeding into a pad this morning. Passed 2 small blood clots. Since noon, bleeding has been pink spotting. Abdominal cramping since 4 pm. Denies recent intercourse, vaginal discharge, or vaginal irritation.   Location: abdomen Quality: cramping Severity: 7/10 on pain scale Duration: <1 day Timing: intermittent Modifying factors: none Associated signs and symptoms: vaginal bleeding  Past Medical History:  Diagnosis Date  . Anemia    with previous pregnancies  . Headache    hx of it "not now"   OB History  Gravida Para Term Preterm AB Living  11 6 6  0 4 6  SAB TAB Ectopic Multiple Live Births  1 3   0 6    # Outcome Date GA Lbr Len/2nd Weight Sex Delivery Anes PTL Lv  11 Current           10 Term 09/18/17 [redacted]w[redacted]d 02:12 / 00:09 3334 g F Vag-Spont None  LIV     Birth Comments: WNL  9 Term 07/10/15 [redacted]w[redacted]d 06:05 / 00:05 2841 g M Vag-Spont None  LIV  8 Term 02/21/12 [redacted]w[redacted]d 17:33 / 00:27 3799 g M Vag-Spont EPI  LIV  7 Term 02/07/08 [redacted]w[redacted]d  3232 g F Vag-Spont EPI N LIV  6 SAB 10/22/06 [redacted]w[redacted]d       FD  5 Term 06/30/05 [redacted]w[redacted]d  3345 g F Vag-Spont EPI  LIV     Complications: Preeclampsia  4 TAB 2005          3 Term 04/25/01 [redacted]w[redacted]d  3289 g F Vag-Spont EPI  LIV  2 TAB           1 TAB            Past Surgical History:  Procedure Laterality Date  . KNEE LIGAMENT RECONSTRUCTION Right 2003   Social History   Socioeconomic History  . Marital status: Single    Spouse name: Not on file  . Number of children: Not on file  . Years of education: Not on file  . Highest  education level: Not on file  Occupational History  . Not on file  Tobacco Use  . Smoking status: Current Every Day Smoker    Packs/day: 0.25    Years: 3.00    Pack years: 0.75    Types: Cigarettes  . Smokeless tobacco: Never Used  . Tobacco comment: declines  Substance and Sexual Activity  . Alcohol use: Not Currently    Comment: occassionally  . Drug use: Not Currently    Types: Marijuana    Comment: Positive one month ago.  2004 Sexual activity: Yes    Birth control/protection: None  Other Topics Concern  . Not on file  Social History Narrative  . Not on file   Social Determinants of Health   Financial Resource Strain:   . Difficulty of Paying Living Expenses: Not on file  Food Insecurity:   . Worried About Marland Kitchen in the Last Year: Not on file  . Ran Out of Food in the Last Year: Not on file  Transportation Needs:   . Lack of  Transportation (Medical): Not on file  . Lack of Transportation (Non-Medical): Not on file  Physical Activity:   . Days of Exercise per Week: Not on file  . Minutes of Exercise per Session: Not on file  Stress:   . Feeling of Stress : Not on file  Social Connections:   . Frequency of Communication with Friends and Family: Not on file  . Frequency of Social Gatherings with Friends and Family: Not on file  . Attends Religious Services: Not on file  . Active Member of Clubs or Organizations: Not on file  . Attends Archivist Meetings: Not on file  . Marital Status: Not on file  Intimate Partner Violence:   . Fear of Current or Ex-Partner: Not on file  . Emotionally Abused: Not on file  . Physically Abused: Not on file  . Sexually Abused: Not on file   Family History  Problem Relation Age of Onset  . Asthma Mother   . Hypertension Mother    No current facility-administered medications on file prior to encounter.   Current Outpatient Medications on File Prior to Encounter  Medication Sig Dispense Refill  .  HYDROcodone-acetaminophen (NORCO/VICODIN) 5-325 MG tablet Take 1 tablet by mouth every 4 (four) hours as needed. 10 tablet 0  . Prenatal Vit-Fe Fumarate-FA (PRENATAL MULTIVITAMIN) TABS tablet Take 1 tablet by mouth daily at 12 noon. (Patient not taking: Reported on 11/26/2018) 60 tablet 0   No Known Allergies  I have reviewed patient's Past Medical Hx, Surgical Hx, Family Hx, Social Hx, medications and allergies.   Review of Systems  Constitutional: Negative.   Gastrointestinal: Positive for abdominal pain.  Genitourinary: Positive for vaginal bleeding.    OBJECTIVE Patient Vitals for the past 24 hrs:  BP Temp Temp src Pulse Resp Height Weight  02/26/19 1945 139/69 -- -- 92 -- -- --  02/26/19 1941 -- 98.4 F (36.9 C) Oral -- 18 5\' 6"  (1.676 m) 89.8 kg   Constitutional: Well-developed, well-nourished female in no acute distress.  Cardiovascular: normal rate & rhythm, no murmur Respiratory: normal rate and effort. Lung sounds clear throughout GI: Abd soft, non-tender, Pos BS x 4. No guarding or rebound tenderness MS: Extremities nontender, no edema, normal ROM Neurologic: Alert and oriented x 4.  GU:     SPECULUM EXAM: NEFG, small amount of dark red blood at os.  BIMANUAL: No CMT. cervix closed; uterus normal size, no adnexal tenderness or masses.    LAB RESULTS Results for orders placed or performed during the hospital encounter of 02/26/19 (from the past 24 hour(s))  Urinalysis, Routine w reflex microscopic     Status: Abnormal   Collection Time: 02/26/19  7:49 PM  Result Value Ref Range   Color, Urine YELLOW YELLOW   APPearance HAZY (A) CLEAR   Specific Gravity, Urine 1.028 1.005 - 1.030   pH 5.0 5.0 - 8.0   Glucose, UA NEGATIVE NEGATIVE mg/dL   Hgb urine dipstick LARGE (A) NEGATIVE   Bilirubin Urine NEGATIVE NEGATIVE   Ketones, ur NEGATIVE NEGATIVE mg/dL   Protein, ur 100 (A) NEGATIVE mg/dL   Nitrite NEGATIVE NEGATIVE   Leukocytes,Ua NEGATIVE NEGATIVE   RBC / HPF 0-5 0  - 5 RBC/hpf   WBC, UA 0-5 0 - 5 WBC/hpf   Bacteria, UA RARE (A) NONE SEEN   Squamous Epithelial / LPF 6-10 0 - 5   Mucus PRESENT    Hyaline Casts, UA PRESENT   Pregnancy, urine POC     Status:  Abnormal   Collection Time: 02/26/19  8:04 PM  Result Value Ref Range   Preg Test, Ur POSITIVE (A) NEGATIVE  Wet prep, genital     Status: Abnormal   Collection Time: 02/26/19  8:33 PM  Result Value Ref Range   Yeast Wet Prep HPF POC NONE SEEN NONE SEEN   Trich, Wet Prep NONE SEEN NONE SEEN   Clue Cells Wet Prep HPF POC PRESENT (A) NONE SEEN   WBC, Wet Prep HPF POC MANY (A) NONE SEEN   Sperm NONE SEEN     IMAGING No results found.  MAU COURSE Orders Placed This Encounter  Procedures  . Wet prep, genital  . US OB Comp Less 14 Wks  . Urinalysis, Routine w reflex microscopic  . Pregnancy, urine POC   No orders of the defined types were placed in this encounter.   MDM RH positive  No imaging previously done to determine location of pregnancy. Ultrasound ordered  Wet prep & GC/CT collected  Ultrasound shows live IUP    A:  1. Threatened miscarriage   2. Vaginal bleeding in pregnancy, first trimester   3. Abdominal pain during pregnancy in first trimester   4. Normal IUP (intrauterine pregnancy) on prenatal ultrasound, first trimester   5. [redacted] weeks gestation of pregnancy    P: Discharge home Bleeding precautions Pelvic rest Start prenatal care GC/CT pending   Judeth Horn, NP 02/26/2019  8:52 PM

## 2019-02-26 NOTE — Discharge Instructions (Signed)
[facility] Prenatal Care Providers   Center for [facility] at [facility]       Phone: [phone]  Center for [facility] at [place] Phone: [phone]  Center for [employer] at [place]  Phone: [phone]  Center for [facility] at [facility]  Phone: [phone]  Center for [facility] at [facility]  Phone: [phone]  [facility]       Phone: [phone]  [facility]    Phone: [phone]   [facility])    Phone: [phone]  [facility]    Phone: [phone]  [facility]    Phone: [phone]   [facility]  Phone: [phone]  [facility]    Phone: [phone]  [facility]   Phone: [phone]  [facility]    Phone: [phone]    Vaginal Bleeding During Pregnancy, First Trimester  A small amount of bleeding from the vagina (spotting) is relatively common during early pregnancy. It usually stops on its own. Various things may cause bleeding or spotting during early pregnancy. Some bleeding may be related to the pregnancy, and some may not. In many cases, the bleeding is normal and is not a problem. However, bleeding can also be a sign of something serious. Be sure to tell your health care provider about any vaginal bleeding right away. Some possible causes of vaginal bleeding during the first trimester include:  Infection or inflammation of the cervix.  Growths (polyps) on the cervix.  Miscarriage or threatened miscarriage.  Pregnancy tissue developing outside of the uterus (ectopic pregnancy).  A mass of tissue developing in the uterus due to an egg being fertilized incorrectly (molar pregnancy). Follow these instructions at home: Activity  Follow instructions from your health care provider about  limiting your activity. Ask what activities are safe for you.  If needed, make plans for someone to help with your regular activities.  Do not have sex or orgasms until your health care provider says that this is safe. General instructions  Take over-the-counter and prescription medicines only as told by your health care provider.  Pay attention to any changes in your symptoms.  Do not use tampons or douche.  Write down how many pads you use each day, how often you change pads, and how soaked (saturated) they are.  If you pass any tissue from your vagina, save the tissue so you can show it to your health care provider.  Keep all follow-up visits as told by your health care provider. This is important. Contact a health care provider if:  You have vaginal bleeding during any part of your pregnancy.  You have cramps or labor pains.  You have a fever. Get help right away if:  You have severe cramps in your back or abdomen.  You pass large clots or a large amount of tissue from your vagina.  Your bleeding increases.  You feel light-headed or weak, or you faint.  You have chills.  You are leaking fluid or have a gush of fluid from your vagina. Summary  A small amount of bleeding (spotting) from the vagina is relatively common during early pregnancy.  Various things may cause bleeding or spotting in early pregnancy.  Be sure to tell your health care provider about any vaginal bleeding right away. This information is not intended to replace advice given to you by your health care provider. Make sure you discuss  any questions you have with your health care provider. Document Revised: 05/13/2018 Document Reviewed: 04/26/2016 Elsevier Patient Education  [place].

## 2019-02-26 NOTE — Progress Notes (Signed)
Judeth Horn NP in earlier to discuss test results and d/c plan. [place]itten and verbal d/c instructions given and understanding voiced

## 2019-02-27 LAB — GC/CHLAMYDIA PROBE AMP (~~LOC~~) NOT AT ARMC
Chlamydia: NEGATIVE
Comment: NEGATIVE
Comment: NORMAL
Neisseria Gonorrhea: NEGATIVE

## 2019-03-15 IMAGING — US US MFM OB COMP +14 WKS
2 of 3 series · 13 of 28 positions shown · non-contrast
Comparison: none

[Series 1: us mfm ob comp +14 wks · 12 of 71 slices shown (1 of 2)]
[im 3/71]
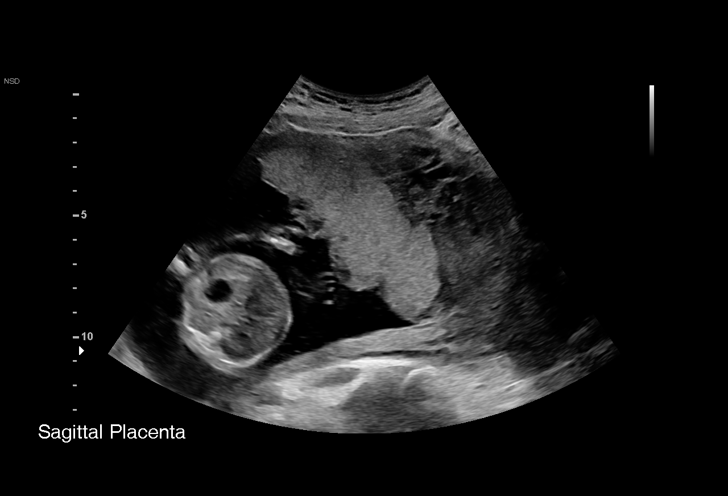
[im 9/71]
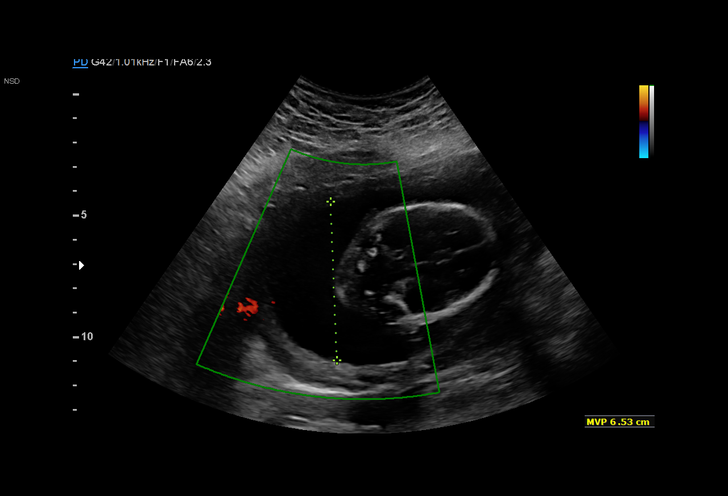
[im 15/71]
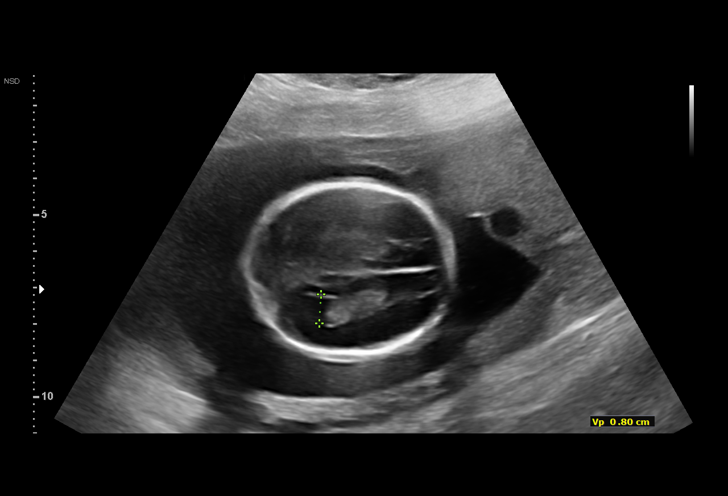
[im 21/71]
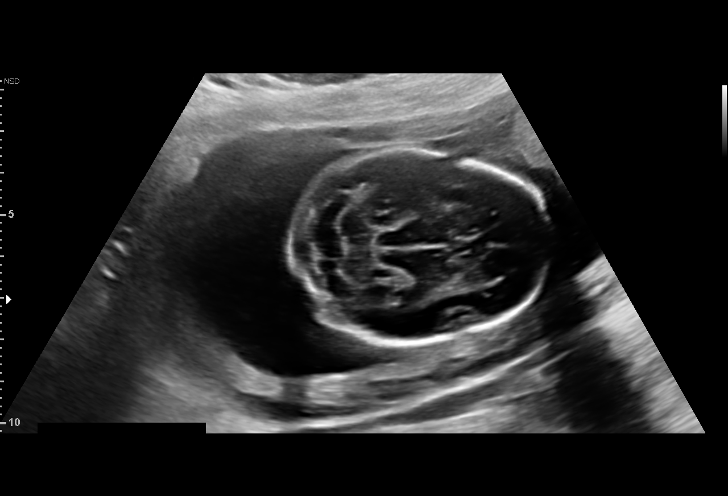
[im 27/71]
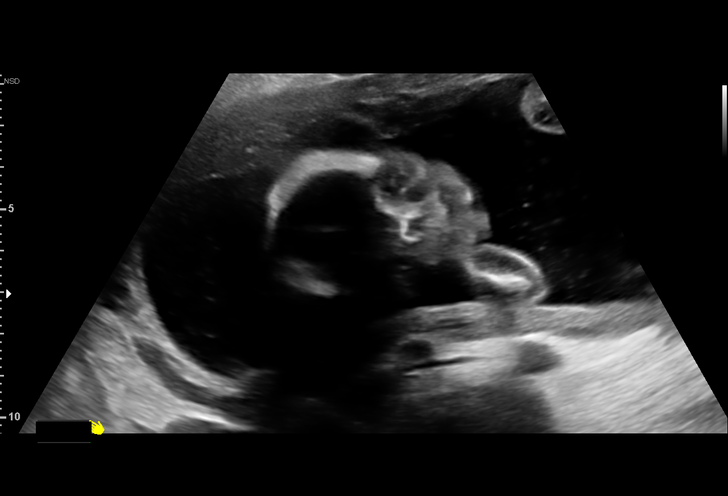
[im 33/71]
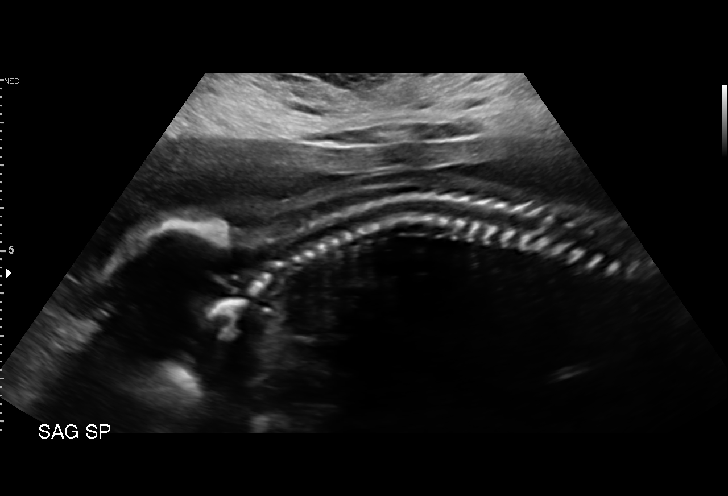
[im 41/71]
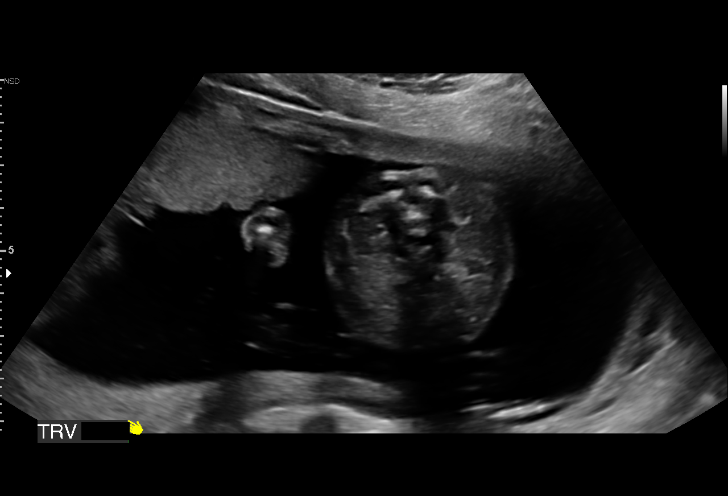
[im 47/71]
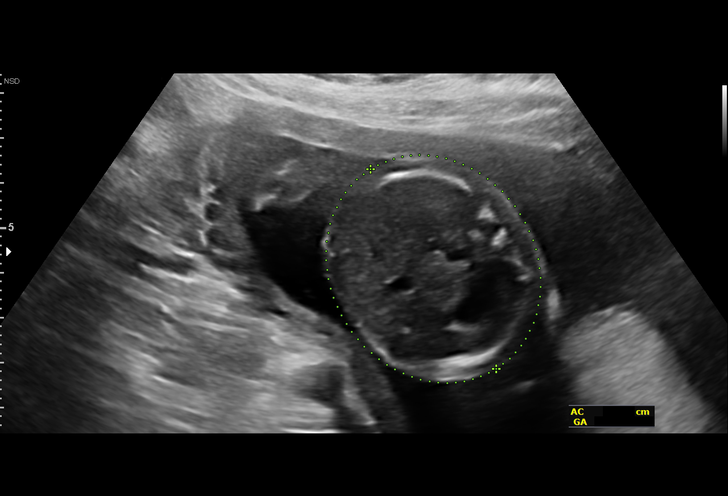
[im 53/71]
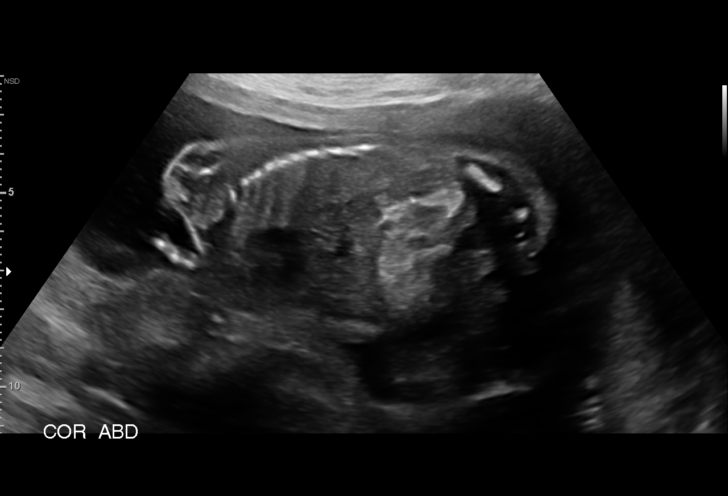
[im 59/71]
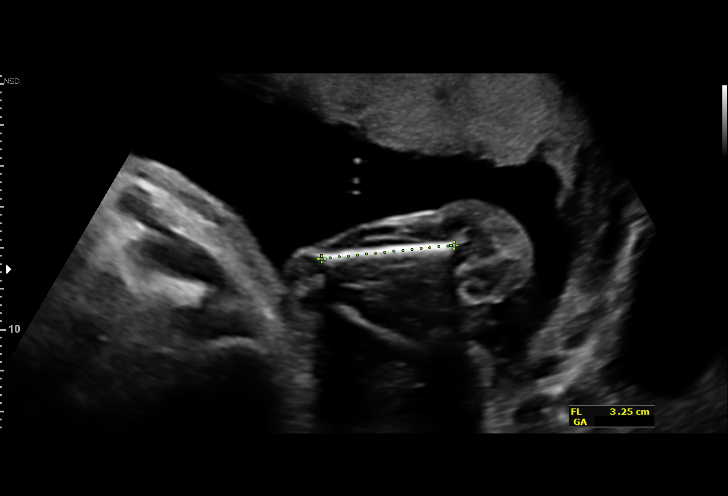
[im 65/71]
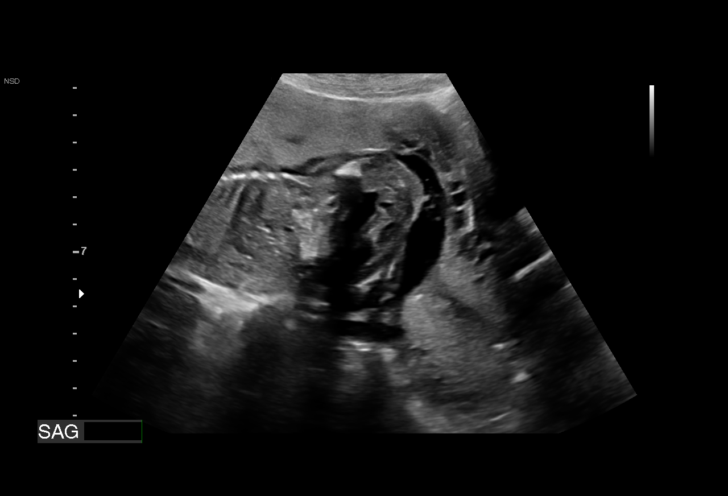
[im 71/71]
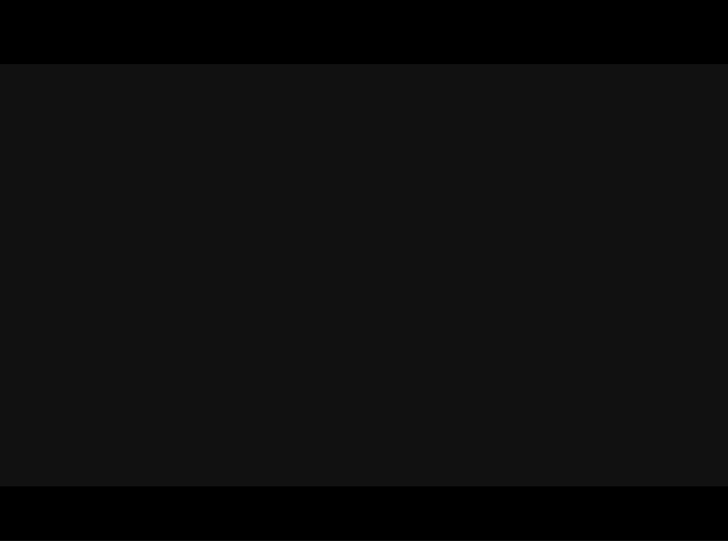

[Series 3: us mfm ob comp +14 wks · 1 of 5 slices shown (2 of 2)]
[im 5/5]
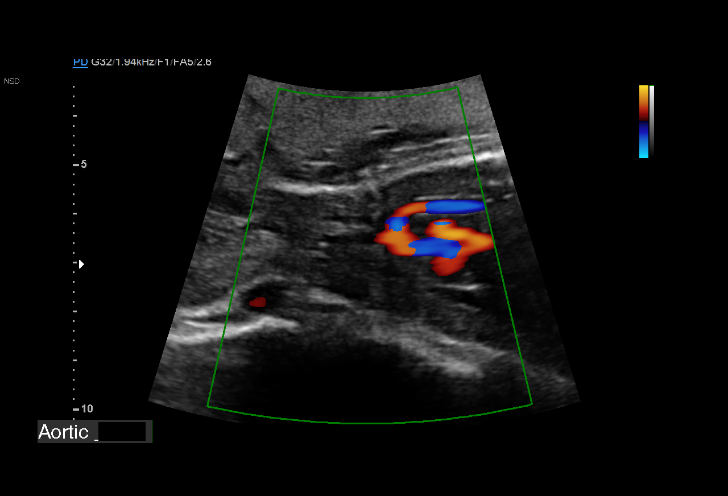

[13 of 28 positions shown; findings below may reference images not displayed]

Attending:        Renso Pullen         Secondary Phy.:    [facility] Nursing-
MAU/Triage

1  JHEMBOY PADAM           [id number]      [id number]     [id number]
Indications

20 weeks gestation of pregnancy
Encounter for uncertain dates
Abdominal pain in pregnancy
OB History

Gravidity:    10        Term:   5         Prem:  0         SAB:   1
TOP:          3       Ectopic:  0        Living: 5
Fetal Evaluation

Num Of Fetuses:     1
Fetal Heart         157
Rate(bpm):
Cardiac Activity:   Observed
Presentation:       Transverse, head to maternal right
Placenta:           Anterior, above cervical os
P. Cord Insertion:  Visualized

Amniotic Fluid
AFI FV:      Subjectively within normal limits

Largest Pocket(cm)
6.53
Biometry

BPD:      46.9  mm     G. Age:  20w 1d         39  %    CI:          68.2  %    70 - 86
FL/HC:       17.8  %    16.8 -
HC:      181.6  mm     G. Age:  20w 4d         48  %    HC/AC:       1.19       1.09 -
AC:      153.2  mm     G. Age:  20w 4d         47  %    FL/BPD:      69.1  %
FL:       32.4  mm     G. Age:  20w 1d         31  %    FL/AC:       21.1  %    20 - 24
HUM:      30.8  mm     G. Age:  20w 2d         45  %
CER:      21.3  mm     G. Age:  20w 2d         47  %

CM:        7.2  mm

Est. FW:     348   gm    0 lb 12 oz     45  %
Gestational Age

U/S Today:     20w 3d                                        EDD:    09/18/17
Best:          20w 3d    Det. By:   U/S (05/04/17)           EDD:    09/18/17
Anatomy

Cranium:               Appears normal         Aortic Arch:            Appears normal
Cavum:                 Not well visualized    Ductal Arch:            Appears normal
Ventricles:            Appears normal         Diaphragm:              Not well visualized
Choroid Plexus:        Appears normal         Stomach:                Appears normal, left
sided
Cerebellum:            Appears normal         Abdomen:                Echogenic Bowel
Posterior Fossa:       Appears normal         Abdominal Wall:         Not well visualized
Nuchal Fold:           Not applicable (>20    Cord Vessels:           Appears normal (3
wks GA)                                        vessel cord)
Face:                  Orbits appear          Kidneys:                Appear normal
normal
Lips:                  Appears normal         Bladder:                Appears normal
Heart:                 Not well visualized    Spine:                  Appears normal
RVOT:                  Not well visualized    Upper Extremities:      Visualized
LVOT:                  Not well visualized    Lower Extremities:      Visualized

Other:  Technically difficult due to fetal position.
Cervix Uterus Adnexa

Cervix
Length:             4.6 cm.
Normal appearance by transabdominal scan.

Left Ovary
Within normal limits.

Right Ovary
Within normal limits.

Adnexa:       No abnormality visualized.
Impression

Single living intrauterine pregnancy at 20w 3d by US today.
Placenta Anterior, above cervical os.
Appropriate fetal growth.
Normal amniotic fluid volume.
The fetal anatomic survey is not complete.
The bowel appears echogenic.
No gross fetal anomalies identified.
The cervix measures 4.6cm transabdominally without
funneling.
The adnexa appear normal bilaterally without masses.
Recommendations

Remote read. Findings of today's ultrasound not discussed
with the patient.
Given echogenic bowel, recommend:
- Follow-up ultrasound for detailed anatomy
- Offer screening for aneuploidy
- Cystic fibrosis testing if carrier status unknown
- CMV serology (IgM and IgG with reflex avidity)

## 2021-03-12 ENCOUNTER — Inpatient Hospital Stay (HOSPITAL_COMMUNITY)
Admission: AD | Admit: 2021-03-12 | Discharge: 2021-03-12 | Disposition: A | Discharge status: Home / Self Care | Payer: Medicaid Other | Attending: Obstetrics and Gynecology | Admitting: Obstetrics and Gynecology

## 2021-03-12 ENCOUNTER — Other Ambulatory Visit: Payer: Self-pay

## 2021-03-12 DIAGNOSIS — N926 Irregular menstruation, unspecified: Secondary | ICD-10-CM | POA: Diagnosis not present

## 2021-03-12 DIAGNOSIS — Z3202 Encounter for pregnancy test, result negative: Secondary | ICD-10-CM

## 2021-03-12 LAB — POCT PREGNANCY, URINE: Preg Test, Ur: NEGATIVE

## 2021-03-12 NOTE — MAU Provider Note (Signed)
Event Date/Time   First Provider Initiated Contact with Patient 03/12/21 1231     S Ms. Heather Good is a 36 y.o. [id number] patient who presents to MAU today with complaint of possible pregnancy. She states she has irregular periods. She did not have a period last month and then started cramping and bleeding today. She has not taken a pregnancy test.   O BP 120/66 (BP Location: Right Arm)    Pulse 97    Temp 98.4 F (36.9 C) (Oral)    Resp 16    Ht 5\' 6"  (1.676 m)    Wt 89.2 kg    SpO2 100% Comment: room air   BMI 31.75 kg/m  Physical Exam Vitals and nursing note reviewed.  Constitutional:      General: She is not in acute distress.    Appearance: She is well-developed.  HENT:     Head: Normocephalic.  Eyes:     Pupils: Pupils are equal, round, and reactive to light.  Cardiovascular:     Rate and Rhythm: Normal rate and regular rhythm.     Heart sounds: Normal heart sounds.  Pulmonary:     Effort: Pulmonary effort is normal. No respiratory distress.     Breath sounds: Normal breath sounds.  Abdominal:     General: Bowel sounds are normal. There is no distension.     Palpations: Abdomen is soft.     Tenderness: There is no abdominal tenderness.  Skin:    General: Skin is warm and dry.  Neurological:     Mental Status: She is alert and oriented to person, place, and time.  Psychiatric:        Mood and Affect: Mood normal.        Behavior: Behavior normal.        Thought Content: Thought content normal.        Judgment: Judgment normal.   Results for orders placed or performed during the hospital encounter of 03/12/21 (from the past 24 hour(s))  Pregnancy, urine POC     Status: None   Collection Time: 03/12/21 12:24 PM  Result Value Ref Range   Preg Test, Ur NEGATIVE NEGATIVE     A Medical screening exam complete 1. Negative pregnancy test   2. Irregular periods     P Discharge from MAU in stable condition Patient given the option of transfer to [facility] for  further evaluation or seek care in outpatient facility of choice  List of options for follow-up given Warning signs for worsening condition that would warrant emergency follow-up discussed Patient may return to MAU as needed   [facility], Rolm Bookbinder 03/12/2021 12:31 PM

## 2021-03-12 NOTE — MAU Note (Signed)
Heather Good is a 36 y.o. at Unknown here in MAU reporting: had a period in December and then none in January. Started bleeding last night and states it has been heavy. Has seen a couple of large clots. Is having abdominal pain.  Onset of complaint: last night  Pain score: 7/10  Vitals:   03/12/21 1229  BP: 120/66  Pulse: 97  Resp: 16  Temp: 98.4 F (36.9 C)  SpO2: 100%     Lab orders placed from triage: upt

## 2021-08-20 ENCOUNTER — Encounter (HOSPITAL_COMMUNITY): Payer: Self-pay | Admitting: Obstetrics & Gynecology

## 2021-08-20 ENCOUNTER — Inpatient Hospital Stay (HOSPITAL_COMMUNITY)
Admission: AD | Admit: 2021-08-20 | Discharge: 2021-08-20 | Disposition: A | Discharge status: Home / Self Care | Payer: Medicaid Other | Attending: Obstetrics & Gynecology | Admitting: Obstetrics & Gynecology

## 2021-08-20 ENCOUNTER — Inpatient Hospital Stay (HOSPITAL_COMMUNITY): Payer: Medicaid Other

## 2021-08-20 DIAGNOSIS — O209 Hemorrhage in early pregnancy, unspecified: Secondary | ICD-10-CM | POA: Diagnosis present

## 2021-08-20 DIAGNOSIS — O09529 Supervision of elderly multigravida, unspecified trimester: Secondary | ICD-10-CM | POA: Insufficient documentation

## 2021-08-20 DIAGNOSIS — O4690 Antepartum hemorrhage, unspecified, unspecified trimester: Secondary | ICD-10-CM | POA: Insufficient documentation

## 2021-08-20 DIAGNOSIS — Z3A00 Weeks of gestation of pregnancy not specified: Secondary | ICD-10-CM | POA: Insufficient documentation

## 2021-08-20 DIAGNOSIS — O039 Complete or unspecified spontaneous abortion without complication: Secondary | ICD-10-CM

## 2021-08-20 LAB — HCG, QUANTITATIVE, PREGNANCY: hCG, Beta Chain, Quant, S: 88 m[IU]/mL — ABNORMAL HIGH (ref <5)

## 2021-08-20 LAB — CBC WITH DIFFERENTIAL/PLATELET
Abs Immature Granulocytes: 0.04 10*3/uL (ref 0.00–0.07)
Basophils Absolute: 0.1 10*3/uL (ref 0.0–0.1)
Basophils Relative: 1 %
Eosinophils Absolute: 0.2 10*3/uL (ref 0.0–0.5)
Eosinophils Relative: 2 %
HCT: 33 % — ABNORMAL LOW (ref 36.0–46.0)
Hemoglobin: 11 g/dL — ABNORMAL LOW (ref 12.0–15.0)
Immature Granulocytes: 0 %
Lymphocytes Relative: 26 %
Lymphs Abs: 3.1 10*3/uL (ref 0.7–4.0)
MCH: 29.3 pg (ref 26.0–34.0)
MCHC: 33.3 g/dL (ref 30.0–36.0)
MCV: 88 fL (ref 80.0–100.0)
Monocytes Absolute: 0.8 10*3/uL (ref 0.1–1.0)
Monocytes Relative: 7 %
Neutro Abs: 7.6 10*3/uL (ref 1.7–7.7)
Neutrophils Relative %: 64 %
Platelets: 213 10*3/uL (ref 150–400)
RBC: 3.75 MIL/uL — ABNORMAL LOW (ref 3.87–5.11)
RDW: 15.2 % (ref 11.5–15.5)
WBC: 11.8 10*3/uL — ABNORMAL HIGH (ref 4.0–10.5)
nRBC: 0 % (ref 0.0–0.2)

## 2021-08-20 LAB — COMPREHENSIVE METABOLIC PANEL
ALT: 10 U/L (ref 0–44)
AST: 14 U/L — ABNORMAL LOW (ref 15–41)
Albumin: 3.1 g/dL — ABNORMAL LOW (ref 3.5–5.0)
Alkaline Phosphatase: 56 U/L (ref 38–126)
Anion gap: 8 (ref 5–15)
BUN: 17 mg/dL (ref 6–20)
CO2: 23 mmol/L (ref 22–32)
Calcium: 8.8 mg/dL — ABNORMAL LOW (ref 8.9–10.3)
Chloride: 107 mmol/L (ref 98–111)
Creatinine, Ser: 0.66 mg/dL (ref 0.44–1.00)
GFR, Estimated: >60 mL/min (ref >60)
Glucose, Bld: 109 mg/dL — ABNORMAL HIGH (ref 70–99)
Potassium: 4 mmol/L (ref 3.5–5.1)
Sodium: 138 mmol/L (ref 135–145)
Total Bilirubin: 0.3 mg/dL (ref 0.3–1.2)
Total Protein: 6 g/dL — ABNORMAL LOW (ref 6.5–8.1)

## 2021-08-20 MED ORDER — MORPHINE SULFATE (PF) 4 MG/ML IV SOLN
4.0000 mg | Freq: Once | INTRAVENOUS | Status: AC
  Administered 2021-08-20: 4 mg via INTRAVENOUS
  Filled 2021-08-20: qty 1

## 2021-08-20 MED ORDER — LACTATED RINGERS IV BOLUS
1000.0000 mL | Freq: Once | INTRAVENOUS | Status: AC
  Administered 2021-08-20: 1000 mL via INTRAVENOUS

## 2021-08-20 MED ORDER — HYDROMORPHONE HCL 1 MG/ML IJ SOLN
2.0000 mg | Freq: Once | INTRAMUSCULAR | Status: AC
  Administered 2021-08-20: 2 mg via INTRAVENOUS
  Filled 2021-08-20: qty 2

## 2021-08-20 NOTE — MAU Note (Signed)
Heather Good is a 36 y.o. at Unknown here in MAU reporting: haven't had a period in 2 months. Today reports abdominal cramping "a little bit". Around 1900 laid down states "feels like I'm having contractions and a lot of blood." Reports sharp cramping pain and then blood will come out. Reports blood clots, states "2 of them was about the size of my fist." Denies taking any home UPTs.  LMP: 04/27/2021 - approximate.  Onset of complaint: 1900 Pain score: 10 Vitals:   08/20/21 0039  BP: (!) 76/52  Pulse: 71  Resp: 19  Temp: 97.9 F (36.6 C)  SpO2: 100%    FHT: n/a  Lab orders placed from triage: urine pregnancy.

## 2021-08-20 NOTE — MAU Provider Note (Addendum)
History     CSN: [id number]  Arrival date and time: 08/20/21 0025   Event Date/Time   First Provider Initiated Contact with Patient 08/20/21 0055      Chief Complaint  Patient presents with   Vaginal Bleeding   LMP was in March and lasted 3 days.  Patient reports it is common for her to have irregular cycles.  She states she has not taken a pregnancy test, but knows she is pregnant. She states she started having some cramping around 1900 that progressed to "contraction type pain" and heavy vaginal bleeding at 11pm.  She states she has passed "2 fist sized" clots.  She reports having similar bleeding with previous losses.      OB History     Gravida  11   Para  6   Term  6   Preterm  0   AB  4   Living  6      SAB  1   IAB  3   Ectopic      Multiple  0   Live Births  6           Past Medical History:  Diagnosis Date   Anemia    with previous pregnancies   Headache    hx of it "not now"    Past Surgical History:  Procedure Laterality Date   KNEE LIGAMENT RECONSTRUCTION Right 2003    Family History  Problem Relation Age of Onset   Asthma Mother    Hypertension Mother     Social History   Tobacco Use   Smoking status: Every Day    Packs/day: 0.25    Years: 3.00    Total pack years: 0.75    Types: Cigarettes   Smokeless tobacco: Never   Tobacco comments:    declines  Substance Use Topics   Alcohol use: Not Currently    Comment: occassionally   Drug use: Not Currently    Types: Marijuana    Comment: Positive one month ago.    Allergies: No Known Allergies  Medications Prior to Admission  Medication Sig Dispense Refill Last Dose   Prenatal Vit-Fe Fumarate-FA (PRENATAL MULTIVITAMIN) TABS tablet Take 1 tablet by mouth daily at 12 noon. (Patient not taking: Reported on 11/26/2018) 60 tablet 0     Review of Systems  Genitourinary:  Positive for vaginal bleeding.   Physical Exam   Blood pressure (!) 76/52, pulse 71, temperature  97.9 F (36.6 C), resp. rate 19, height 5\' 6"  (1.676 m), weight 86.2 kg, SpO2 100 %, unknown if currently breastfeeding. Vitals:   08/20/21 0039 08/20/21 0125  BP: (!) 76/52 106/61  Pulse: 71 72  Resp: 19   Temp: 97.9 F (36.6 C)   SpO2: 100%   Weight: 86.2 kg   Height: 5\' 6"  (1.676 m)     Physical Exam Vitals reviewed. Exam conducted with a chaperone present.  Constitutional:      Appearance: Normal appearance.  HENT:     Head: Normocephalic and atraumatic.  Eyes:     Conjunctiva/sclera: Conjunctivae normal.  Cardiovascular:     Rate and Rhythm: Normal rate.     Heart sounds: Normal heart sounds.  Pulmonary:     Effort: Pulmonary effort is normal. No respiratory distress.  Abdominal:     General: Bowel sounds are normal.  Genitourinary:    Comments: Speculum Exam: -Normal External Genitalia: Non tender, blood noted at introitus.  -Vaginal Vault: Pink mucosa with good rugae. Moderate amt  blood removed with ring forcep and 4x4  -Cervix:Pink, no lesions, cysts, or polyps.  Appears closed. Scant bleeding from os- -Bimanual Exam: Deferred   Musculoskeletal:        General: Normal range of motion.     Cervical back: Normal range of motion.  Skin:    General: Skin is warm and dry.  Neurological:     Mental Status: She is alert and oriented to person, place, and time.  Psychiatric:        Mood and Affect: Mood normal.        Behavior: Behavior normal.     MAU Course  Procedures Results for orders placed or performed during the hospital encounter of 08/20/21 (from the past 24 hour(s))  CBC with Differential/Platelet     Status: Abnormal   Collection Time: 08/20/21 12:18 AM  Result Value Ref Range   WBC 11.8 (H) 4.0 - 10.5 K/uL   RBC 3.75 (L) 3.87 - 5.11 MIL/uL   Hemoglobin 11.0 (L) 12.0 - 15.0 g/dL   HCT 41.9 (L) 37.9 - 02.4 %   MCV 88.0 80.0 - 100.0 fL   MCH 29.3 26.0 - 34.0 pg   MCHC 33.3 30.0 - 36.0 g/dL   RDW 09.7 35.3 - 29.9 %   Platelets 213 150 - 400  K/uL   nRBC 0.0 0.0 - 0.2 %   Neutrophils Relative % 64 %   Neutro Abs 7.6 1.7 - 7.7 K/uL   Lymphocytes Relative 26 %   Lymphs Abs 3.1 0.7 - 4.0 K/uL   Monocytes Relative 7 %   Monocytes Absolute 0.8 0.1 - 1.0 K/uL   Eosinophils Relative 2 %   Eosinophils Absolute 0.2 0.0 - 0.5 K/uL   Basophils Relative 1 %   Basophils Absolute 0.1 0.0 - 0.1 K/uL   Immature Granulocytes 0 %   Abs Immature Granulocytes 0.04 0.00 - 0.07 K/uL  Comprehensive metabolic panel     Status: Abnormal   Collection Time: 08/20/21 12:18 AM  Result Value Ref Range   Sodium 138 135 - 145 mmol/L   Potassium 4.0 3.5 - 5.1 mmol/L   Chloride 107 98 - 111 mmol/L   CO2 23 22 - 32 mmol/L   Glucose, Bld 109 (H) 70 - 99 mg/dL   BUN 17 6 - 20 mg/dL   Creatinine, Ser 2.42 0.44 - 1.00 mg/dL   Calcium 8.8 (L) 8.9 - 10.3 mg/dL   Total Protein 6.0 (L) 6.5 - 8.1 g/dL   Albumin 3.1 (L) 3.5 - 5.0 g/dL   AST 14 (L) 15 - 41 U/L   ALT 10 0 - 44 U/L   Alkaline Phosphatase 56 38 - 126 U/L   Total Bilirubin 0.3 0.3 - 1.2 mg/dL   GFR, Estimated >68 >34 mL/min   Anion gap 8 5 - 15  hCG, quantitative, pregnancy     Status: Abnormal   Collection Time: 08/20/21 12:18 AM  Result Value Ref Range   hCG, Beta Chain, Quant, S 88 (H) <5 mIU/mL   US OB LESS THAN 14 WEEKS WITH OB TRANSVAGINAL  Result Date: 08/20/2021 CLINICAL DATA:  Heavy vaginal bleeding, positive pregnancy test, LMP 04/27/2021 EXAM: OBSTETRIC <14 WK ULTRASOUND TECHNIQUE: Transabdominal ultrasound was performed for evaluation of the gestation as well as the maternal uterus and adnexal regions. COMPARISON:  None Available. FINDINGS: Intrauterine gestational sac: None identified Maternal uterus/adnexae: The uterus is anteverted. The cervix is closed and is unremarkable. No intrauterine masses are seen. The endometrium is thickened  measuring up to 28 mm in thickness. As noted above, no intrauterine gestational sac is identified. There is mild simple appearing free fluid within  the cul-de-sac. The right ovary is unremarkable. The left ovary is not visualized. IMPRESSION: Pregnancy location not visualized sonographically. Differential diagnosis includes recent spontaneous abortion, IUP too early to visualize, and non-visualized ectopic pregnancy. Recommend close follow up of quantitative B-HCG levels, and follow up US as clinically warranted. Electronically Signed   By: Helyn Numbers M.D.   On: 08/20/2021 03:50    MDM Pelvic Exam Labs: UA,  CBC, hCG, CMP Ultrasound Pain Medication Start IV Assessment and Plan  36 year old [id number] at Unknown [place] Vaginal Bleeding  -Patient with heavy bleeding while sitting in triage room talking with provider.  -Initially patient states she is unable to urinate, but then says she can.  -Nurse takes patient to bathroom and upon standing, patient passes 2 baseball sized clots. -Nurse instructed to take patient to room, start IV, draw labs, and give pain medications.  -Morphine and labs ordered. -Patient alert and oriented. -Will work on stabilizing then perform pelvic exam.   Cherre Robins 08/20/2021, 12:55 AM   Reassessment (2:11 AM) -Provider to beside to perform pelvic exam. -Likely POC noted, in pad, and sent to pathology. -Pelvic exam performed as above.  -Patient reports continued pain despite morphine dosing. -Dilaudid 2mg  ordered -Will send for . -hCG pending  Reassessment (3:58 AM) Likely SAB  -[place] results as above. -Patient reports no current pain. -Bleeding minimal. -Discussed need to follow up in 2 weeks. -Precautions reviewed.  -Encouraged to call primary office or return to MAU if symptoms worsen or with the onset of new symptoms. -Discharged to home in stable condition. -Message sent to [facility].  [place] MSN, CNM Advanced Practice Provider, [facility] for [facility]

## 2021-08-22 LAB — SURGICAL PATHOLOGY

## 2021-09-06 NOTE — Progress Notes (Deleted)
   GYNECOLOGY OFFICE VISIT NOTE  History:   Heather Good is a 36 y.o. [id number] here today for ***. She denies any abnormal vaginal discharge, bleeding, pelvic pain or other concerns.    Past Medical History:  Diagnosis Date   Anemia    with previous pregnancies   Headache    hx of it "not now"    Past Surgical History:  Procedure Laterality Date   KNEE LIGAMENT RECONSTRUCTION Right 2003    The following portions of the patient's history were reviewed and updated as appropriate: allergies, current medications, past family history, past medical history, past social history, past surgical history and problem list.   Health Maintenance:  Normal pap and negative HRHPV on ***.  Normal mammogram on ***.   Review of Systems:  Pertinent items noted in HPI and remainder of comprehensive ROS otherwise negative.  Physical Exam:  There were no vitals taken for this visit. CONSTITUTIONAL: Well-developed, well-nourished female in no acute distress.  HEENT:  Normocephalic, atraumatic. External right and left ear normal. No scleral icterus.  NECK: Normal range of motion, supple, no masses noted on observation SKIN: No rash noted. Not diaphoretic. No erythema. No pallor. MUSCULOSKELETAL: Normal range of motion. No edema noted. NEUROLOGIC: Alert and oriented to person, place, and time. Normal muscle tone coordination. No cranial nerve deficit noted. PSYCHIATRIC: Normal mood and affect. Normal behavior. Normal judgment and thought content. CARDIOVASCULAR: Normal heart rate noted RESPIRATORY: Effort and breath sounds normal, no problems with respiration noted ABDOMEN: No masses noted. No other overt distention noted.   PELVIC: {Blank single:19197::"Deferred","Normal appearing external genitalia; normal urethral meatus; normal appearing vaginal mucosa and cervix.  No abnormal discharge noted.  Normal uterine size, no other palpable masses, no uterine or adnexal tenderness. Performed in the  presence of a chaperone"}  Labs and Imaging No results found for this or any previous visit (from the past 168 hour(s)). US OB LESS THAN 14 WEEKS WITH OB TRANSVAGINAL  Result Date: 08/20/2021 CLINICAL DATA:  Heavy vaginal bleeding, positive pregnancy test, LMP 04/27/2021 EXAM: OBSTETRIC <14 WK ULTRASOUND TECHNIQUE: Transabdominal ultrasound was performed for evaluation of the gestation as well as the maternal uterus and adnexal regions. COMPARISON:  None Available. FINDINGS: Intrauterine gestational sac: None identified Maternal uterus/adnexae: The uterus is anteverted. The cervix is closed and is unremarkable. No intrauterine masses are seen. The endometrium is thickened measuring up to 28 mm in thickness. As noted above, no intrauterine gestational sac is identified. There is mild simple appearing free fluid within the cul-de-sac. The right ovary is unremarkable. The left ovary is not visualized. IMPRESSION: Pregnancy location not visualized sonographically. Differential diagnosis includes recent spontaneous abortion, IUP too early to visualize, and non-visualized ectopic pregnancy. Recommend close follow up of quantitative B-HCG levels, and follow up US as clinically warranted. Electronically Signed   By: Helyn Numbers M.D.   On: 08/20/2021 03:50      Assessment and Plan:    1. History of spontaneous abortion ***   Routine preventative health maintenance measures emphasized. Please refer to After Visit Summary for other counseling recommendations.   No follow-ups on file.    I spent {Blank single:19197::"10","15","20","25","30"} minutes dedicated to the care of this patient including pre-visit review of records, face to face time with the patient discussing her conditions and treatments and post visit orders.    Jaynie Collins, MD, FACOG Obstetrician & Gynecologist, [facility] for [employer], [facility]

## 2021-09-07 ENCOUNTER — Encounter: Payer: Medicaid Other | Admitting: Family Medicine

## 2021-09-07 DIAGNOSIS — Z8759 Personal history of other complications of pregnancy, childbirth and the puerperium: Secondary | ICD-10-CM

## 2022-12-30 ENCOUNTER — Inpatient Hospital Stay (HOSPITAL_COMMUNITY)
Admission: AD | Admit: 2022-12-30 | Discharge: 2022-12-30 | Disposition: A | Discharge status: Home / Self Care | Payer: 59 | Attending: Obstetrics and Gynecology | Admitting: Obstetrics and Gynecology

## 2022-12-30 ENCOUNTER — Other Ambulatory Visit: Payer: Self-pay

## 2022-12-30 ENCOUNTER — Encounter (HOSPITAL_COMMUNITY): Payer: Self-pay | Admitting: Obstetrics and Gynecology

## 2022-12-30 DIAGNOSIS — Z3202 Encounter for pregnancy test, result negative: Secondary | ICD-10-CM | POA: Diagnosis not present

## 2022-12-30 DIAGNOSIS — N926 Irregular menstruation, unspecified: Secondary | ICD-10-CM

## 2022-12-30 DIAGNOSIS — N939 Abnormal uterine and vaginal bleeding, unspecified: Secondary | ICD-10-CM | POA: Diagnosis not present

## 2022-12-30 LAB — POCT PREGNANCY, URINE: Preg Test, Ur: NEGATIVE

## 2022-12-30 NOTE — MAU Provider Note (Signed)
Chief Complaint: Vaginal Bleeding   Event Date/Time   First Provider Initiated Contact with Patient 12/30/22 1521      SUBJECTIVE HPI: Heather Good is a 37 y.o. [id number] who presents to maternity admissions for vaginal bleeding. She reports yesterday she had clotting and heavier bleeding than she is used to so she thought she was having a miscarriage. Bleeding has improved today. She has a hx of irregular periods and hasn't had one since August. Did not take a UPT at home.    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     08/14/2017 HGSIL again at 35 weeks -> NEEDS COLPOSCOPY and biopsies and ECC!!! Can again be deferred until postpartum and patient will be cautioned about concern about cervical cancer!!     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   POS [facility] 07/30/2017     Past Surgical History:  Procedure Laterality Date   KNEE LIGAMENT RECONSTRUCTION Right 2003   Social History   Socioeconomic History   Marital status: Single    Spouse name: Not on file   Number of children: Not on file   Years of education: Not on file   Highest education level: Not on file  Occupational History   Not on file  Tobacco Use   Smoking status: Every Day    Current packs/day: 0.25    Average packs/day: 0.3 packs/day for 3.0 years (0.8 ttl pk-yrs)    Types: Cigarettes   Smokeless tobacco: Never   Tobacco comments:    declines  Substance and Sexual Activity   Alcohol use: Not Currently    Comment: occassionally   Drug use: Not Currently    Types: Marijuana    Comment: Positive one month ago.   Sexual activity: Yes    Birth control/protection: None   Other Topics Concern   Not on file  Social History Narrative   Not on file   Social Determinants of Health   Financial Resource Strain: Not on file  Food Insecurity: Not on file  Transportation Needs: Not on file  Physical Activity: Not on file  Stress: Not on file  Social Connections: Unknown (06/18/2021)   [facility] from [facility]   Social Network    Social Network: Not on file  Intimate Partner Violence: Unknown (05/11/2021)   Received from [facility]   HITS    Physically Hurt: Not on file    Insult or Talk Down To: Not on file    Threaten Physical Harm: Not on file    Scream or Curse: Not on file   No current facility-administered medications on file prior to encounter.   Current Outpatient Medications on File Prior to Encounter  Medication Sig Dispense Refill   Prenatal Vit-Fe Fumarate-FA (PRENATAL MULTIVITAMIN) TABS tablet Take 1 tablet by mouth daily at 12 noon. (Patient not taking: Reported on 11/26/2018) 60 tablet 0   No Known Allergies  ROS:  Review of Systems  Constitutional: Negative.  Negative for fatigue and fever.  HENT: Negative.    Respiratory: Negative.  Negative for shortness of breath.   Cardiovascular: Negative.  Negative for chest pain.  Gastrointestinal: Negative.  Negative for abdominal pain, constipation, diarrhea, nausea and vomiting.  Genitourinary: Negative.  Negative for dysuria.  Neurological: Negative.  Negative for dizziness and headaches.    I have reviewed patient's Past Medical Hx, Surgical Hx, Family Hx, Social Hx, medications and allergies.   Physical Exam  Patient Vitals for the past 24 hrs:  BP Temp Temp src Pulse Resp SpO2 Height Weight  12/30/22 1513 136/70 98.2 F (36.8 C) Oral 79 18 100 % 5\' 6"  (1.676 m) 94.3 kg   Physical Exam Vitals and nursing note reviewed.  Constitutional:      General: She is not in acute distress.    Appearance: She is well-developed.  HENT:     Head:  Normocephalic.  Eyes:     Pupils: Pupils are equal, round, and reactive to light.  Cardiovascular:     Rate and Rhythm: Normal rate and regular rhythm.     Heart sounds: Normal heart sounds.  Pulmonary:     Effort: Pulmonary effort is normal. No respiratory distress.     Breath sounds: Normal breath sounds.  Abdominal:     General: Bowel sounds are normal. There is no distension.     Palpations: Abdomen is soft.     Tenderness: There is no abdominal tenderness.  Skin:    General: Skin is warm and dry.  Neurological:     Mental Status: She is alert and oriented to person, place, and time.  Psychiatric:        Mood and Affect: Mood normal.        Behavior: Behavior normal.        Thought Content: Thought content normal.        Judgment: Judgment normal.    Results for orders placed or performed during the hospital encounter of 12/30/22 (from the past 24 hour(s))  Pregnancy, urine POC     Status: None   Collection Time: 12/30/22  3:09 PM  Result Value Ref Range   Preg Test, Ur NEGATIVE NEGATIVE     MDM Negative UPT. Reassurance provided and list given to establish care with gyn If irregular periods continue.   ASSESSMENT MSE Complete 1. Negative pregnancy test   2. Irregular periods     PLAN -Discharge home in stable condition -Vaginal bleeding and pain precautions discussed -Patient advised to follow-up with gyn to establish care -Patient may return to MAU as needed or if her condition were to change or worsen   Rolm Bookbinder, [place] 12/30/2022 3:22 PM

## 2022-12-30 NOTE — Discharge Instructions (Signed)
Prenatal Care Providers           Center for [facility] @ [facility]  [address] [phone]  Center for [facility] @ [facility]   [address]  [phone]  [facility] @ [facility]       [address] [phone]            Center for [facility] @ [facility]     [phone]          Center for [facility] @ [facility]   [address] [phone]  Center for [facility] @ [facility]  [address] [phone]     Center for [facility] @ [address] [place])  [address]   [phone]     [facility]  Phone: [phone]  [facility]  Phone: [phone]  [facility] Phone: [phone]  [facility] Phone: [phone]  [facility] Phone: [phone]  [facility] Phone: [phone]  [facility]  Phone: [phone]

## 2022-12-30 NOTE — MAU Note (Signed)
.  Heather Good is a 37 y.o. at Unknown here in MAU reporting: heavy bleeding started Monday, with "big clots" starting yesterday. Has not had a positive pregnancy test at home.    LMP: beginning of August  Pain score: 8/10 lower abdominal cramping.  Vitals:   12/30/22 1513  BP: 136/70  Pulse: 79  Resp: 18  Temp: 98.2 F (36.8 C)  SpO2: 100%      Lab orders placed from triage:   UPT

## 2023-10-21 ENCOUNTER — Encounter (HOSPITAL_COMMUNITY): Payer: Self-pay

## 2023-10-21 ENCOUNTER — Ambulatory Visit (HOSPITAL_COMMUNITY)
Admission: EM | Admit: 2023-10-21 | Discharge: 2023-10-21 | Disposition: A | Discharge status: Home / Self Care | Attending: Emergency Medicine | Admitting: Emergency Medicine

## 2023-10-21 DIAGNOSIS — H6122 Impacted cerumen, left ear: Secondary | ICD-10-CM | POA: Diagnosis not present

## 2023-10-21 NOTE — ED Triage Notes (Signed)
 Patient here today with c/o left ear pain and fullness since Friday. Patient has tried to clean it with peroxide and use a q-tip with no relief. Patient has taken Tylenol  with little relief.   Patient also states that her LMP was on 08/16/2023 and thinks she may be pregnant but unsure. Patient states that she did not want to be tested for pregnancy though.

## 2023-10-21 NOTE — Discharge Instructions (Signed)
 Please do not use q-tips or other objects in the ears  You can use Debrox ear drops -- once a week or twice a week to prevent wax build up!

## 2023-10-21 NOTE — ED Provider Notes (Signed)
 [facility]    CSN: [id number] Arrival date & time: 10/21/23  1254      History   Chief Complaint Chief Complaint  Patient presents with   Ear Fullness    HPI Heather Good is a 38 y.o. female.  Left ear pain and fullness for 2 days.  Tried cleaning with peroxide and Q-tip.  Also tried using Tylenol  for pain. She does use q-tips regularly to clean the ears  LMP 7/11.  She does not know if she may be pregnant or not.  She is declining a pregnancy test today.  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     08/14/2017 HGSIL again at 35 weeks -> NEEDS COLPOSCOPY and biopsies and ECC!!! Can again be deferred until postpartum and patient will be cautioned about concern about cervical cancer!!     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   POS [facility] 07/30/2017      Patient Active Problem List   Diagnosis Date Noted   Grand multiparity with current pregnancy, antepartum 05/02/2015    Past Surgical History:  Procedure Laterality Date   KNEE LIGAMENT RECONSTRUCTION Right 2003    OB History     Gravida  11   Para  6   Term  6   Preterm  0   AB  4   Living  6      SAB  1   IAB  3   Ectopic      Multiple  0   Live Births  6            Home Medications    Prior to Admission medications   Not on File    Family History Family History  Problem Relation Age of Onset   Asthma Mother    Hypertension Mother     Social History Social History   Tobacco Use   Smoking status: Every Day    Current packs/day: 0.25    Average packs/day: 0.3 packs/day for 3.0 years (0.8 ttl pk-yrs)    Types: Cigarettes    Smokeless tobacco: Never   Tobacco comments:    declines  Substance Use Topics   Alcohol use: Not Currently    Comment: occassionally   Drug use: Not Currently    Types: Marijuana    Comment: Positive one month ago.     Allergies   Patient has no known allergies.   Review of Systems Review of Systems  As per HPI  Physical Exam Triage Vital Signs ED Triage Vitals  Encounter Vitals Group     BP 10/21/23 1524 127/76     Girls Systolic BP Percentile --      Girls Diastolic BP Percentile --      Boys Systolic BP Percentile --      Boys Diastolic BP Percentile --      Pulse Rate 10/21/23 1524 78     Resp 10/21/23 1524 16     Temp 10/21/23 1524 98.8 F (37.1 C)     Temp Source 10/21/23 1524 Oral     SpO2 10/21/23 1524 98 %  Weight --      Height --      Head Circumference --      Peak Flow --      Pain Score 10/21/23 1526 10     Pain Loc --      Pain Education --      Exclude from Growth Chart --    No data found.  Updated Vital Signs BP 127/76 (BP Location: Right Arm)   Pulse 78   Temp 98.8 F (37.1 C) (Oral)   Resp 16   LMP 08/16/2023 (Approximate)   SpO2 98%   Breastfeeding No   Visual Acuity Right Eye Distance:   Left Eye Distance:   Bilateral Distance:    Right Eye Near:   Left Eye Near:    Bilateral Near:     Physical Exam Vitals and nursing note reviewed.  Constitutional:      General: She is not in acute distress.    Appearance: Normal appearance.  HENT:     Ears:     Comments: Left ear has hard wax pressed against the TM. Right canal has a very tiny amount of wax in the canal. TM is visualized, normal.     Mouth/Throat:     Pharynx: Oropharynx is clear.  Cardiovascular:     Rate and Rhythm: Normal rate and regular rhythm.     Heart sounds: Normal heart sounds.  Pulmonary:     Effort: Pulmonary effort is normal.     Breath sounds: Normal breath sounds.  Neurological:     Mental Status: She is alert and oriented to person, place,  and time.     UC Treatments / Results  Labs (all labs ordered are listed, but only abnormal results are displayed) Labs Reviewed - No data to display  EKG  Radiology No results found.  Procedures Procedures   Medications Ordered in UC Medications - No data to display  Initial Impression / Assessment and Plan / UC Course  I have reviewed the triage vital signs and the nursing notes.  Pertinent labs & imaging results that were available during my care of the patient were reviewed by me and considered in my medical decision making (see chart for details).  Left ear irrigated successfully. Symptoms resolved. She is requesting irrigation of right ear as well; there is a minuscule amount of wax in this ear however CMA performs irrigation per her request. On reassessment, canals are clear, TMs normal  Advised not using any q-tips or other objects in the ears Debrox 1-2 times weekly for prevention of wax buildup  Agrees with plan, no questions  Final Clinical Impressions(s) / UC Diagnoses   Final diagnoses:  Excessive ear wax, left     Discharge Instructions      Please do not use q-tips or other objects in the ears  You can use Debrox ear drops -- once a week or twice a week to prevent wax build up!     ED Prescriptions   None    PDMP not reviewed this encounter.   Ilana Prezioso, [place], PA-C 10/21/23 1622

## 2023-10-28 ENCOUNTER — Inpatient Hospital Stay (HOSPITAL_BASED_OUTPATIENT_CLINIC_OR_DEPARTMENT_OTHER)

## 2023-10-28 ENCOUNTER — Inpatient Hospital Stay (HOSPITAL_COMMUNITY)

## 2023-10-28 ENCOUNTER — Inpatient Hospital Stay (HOSPITAL_COMMUNITY)
Admission: AD | Admit: 2023-10-28 | Discharge: 2023-10-28 | Disposition: A | Discharge status: Home / Self Care | Attending: Obstetrics and Gynecology | Admitting: Obstetrics and Gynecology

## 2023-10-28 ENCOUNTER — Encounter (HOSPITAL_COMMUNITY): Payer: Self-pay

## 2023-10-28 DIAGNOSIS — Z3A14 14 weeks gestation of pregnancy: Secondary | ICD-10-CM | POA: Diagnosis not present

## 2023-10-28 DIAGNOSIS — O26892 Other specified pregnancy related conditions, second trimester: Secondary | ICD-10-CM

## 2023-10-28 DIAGNOSIS — N949 Unspecified condition associated with female genital organs and menstrual cycle: Secondary | ICD-10-CM

## 2023-10-28 DIAGNOSIS — R1032 Left lower quadrant pain: Secondary | ICD-10-CM | POA: Diagnosis not present

## 2023-10-28 DIAGNOSIS — Z3A19 19 weeks gestation of pregnancy: Secondary | ICD-10-CM

## 2023-10-28 DIAGNOSIS — Z3687 Encounter for antenatal screening for uncertain dates: Secondary | ICD-10-CM | POA: Diagnosis not present

## 2023-10-28 DIAGNOSIS — O09523 Supervision of elderly multigravida, third trimester: Secondary | ICD-10-CM | POA: Diagnosis not present

## 2023-10-28 DIAGNOSIS — R102 Pelvic and perineal pain: Secondary | ICD-10-CM | POA: Diagnosis not present

## 2023-10-28 DIAGNOSIS — R109 Unspecified abdominal pain: Secondary | ICD-10-CM

## 2023-10-28 DIAGNOSIS — Z363 Encounter for antenatal screening for malformations: Secondary | ICD-10-CM | POA: Diagnosis not present

## 2023-10-28 LAB — URINALYSIS, ROUTINE W REFLEX MICROSCOPIC
Bilirubin Urine: NEGATIVE
Glucose, UA: NEGATIVE mg/dL
Hgb urine dipstick: NEGATIVE
Ketones, ur: NEGATIVE mg/dL
Nitrite: NEGATIVE
Protein, ur: 100 mg/dL — AB
Specific Gravity, Urine: 1.021 (ref 1.005–1.030)
pH: 6 (ref 5.0–8.0)

## 2023-10-28 LAB — COMPREHENSIVE METABOLIC PANEL WITH GFR
ALT: 13 U/L (ref 0–44)
AST: 15 U/L (ref 15–41)
Albumin: 2.4 g/dL — ABNORMAL LOW (ref 3.5–5.0)
Alkaline Phosphatase: 64 U/L (ref 38–126)
Anion gap: 10 (ref 5–15)
BUN: 8 mg/dL (ref 6–20)
CO2: 19 mmol/L — ABNORMAL LOW (ref 22–32)
Calcium: 8.1 mg/dL — ABNORMAL LOW (ref 8.9–10.3)
Chloride: 106 mmol/L (ref 98–111)
Creatinine, Ser: 0.6 mg/dL (ref 0.44–1.00)
GFR, Estimated: >60 mL/min (ref >60)
Glucose, Bld: 81 mg/dL (ref 70–99)
Potassium: 4.2 mmol/L (ref 3.5–5.1)
Sodium: 135 mmol/L (ref 135–145)
Total Bilirubin: 0.5 mg/dL (ref 0.0–1.2)
Total Protein: 5.7 g/dL — ABNORMAL LOW (ref 6.5–8.1)

## 2023-10-28 LAB — ABO/RH: ABO/RH(D): O POS

## 2023-10-28 LAB — CBC
HCT: 30 % — ABNORMAL LOW (ref 36.0–46.0)
Hemoglobin: 9.7 g/dL — ABNORMAL LOW (ref 12.0–15.0)
MCH: 26.8 pg (ref 26.0–34.0)
MCHC: 32.3 g/dL (ref 30.0–36.0)
MCV: 82.9 fL (ref 80.0–100.0)
Platelets: 240 K/uL (ref 150–400)
RBC: 3.62 MIL/uL — ABNORMAL LOW (ref 3.87–5.11)
RDW: 17.3 % — ABNORMAL HIGH (ref 11.5–15.5)
WBC: 12.4 K/uL — ABNORMAL HIGH (ref 4.0–10.5)
nRBC: 0 % (ref 0.0–0.2)

## 2023-10-28 LAB — HCG, QUANTITATIVE, PREGNANCY: hCG, Beta Chain, Quant, S: 24993 m[IU]/mL — ABNORMAL HIGH (ref <5)

## 2023-10-28 LAB — POCT PREGNANCY, URINE: Preg Test, Ur: POSITIVE — AB

## 2023-10-28 MED ORDER — PRENATAL 28-0.8 MG PO TABS: 1.0000 | ORAL_TABLET | Freq: Every day | ORAL | 12 refills | Status: AC

## 2023-10-28 NOTE — MAU Note (Signed)
..  Heather Good is a 38 y.o. at Unknown here in MAU reporting: missed her period for the last few months. Has not taken a home pregnancy test. Reports that she is now having lower abdominal cramping that comes and goes. Denies vaginal bleeding or abnormal discharge.  LMP: 07/22/23  Pain score: 6 Vitals:   10/28/23 1840  BP: 138/83  Pulse: 84  Resp: 16  Temp: 98.3 F (36.8 C)  SpO2: 100%     FHT:n/a Lab orders placed from triage:   UPT

## 2023-10-28 NOTE — MAU Provider Note (Signed)
 Chief Complaint:  Abdominal Pain   HPI   Heather Good is a 38 y.o. [id number] at [redacted]w[redacted]d who presents to maternity admissions reporting intermittent LLQ pain, describes it as cramping. She has tried Tylenol  for it with no relief, last dose 1g at 1pm prior to presenting for further evaluation. She denies leakage of fluid, vaginal bleeding, nausea or vomiting.    Pregnancy Course: She does not have a prenatal provider.  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     08/14/2017 HGSIL again at 35 weeks -> NEEDS COLPOSCOPY and biopsies and ECC!!! Can again be deferred until postpartum and patient will be cautioned about concern about cervical cancer!!     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   POS [facility] 07/30/2017     OB History  Gravida Para Term Preterm AB Living  12 6 6  0 4 6  SAB IAB Ectopic Multiple Live Births  1 3  0 6    # Outcome Date GA Lbr Len/2nd Weight Sex Type Anes PTL Lv  12 Current           11 Term 09/18/17 [redacted]w[redacted]d 02:12 / 00:09 3334 g F Vag-Spont None  LIV     Birth Comments: WNL  10 Term 07/10/15 [redacted]w[redacted]d 06:05 / 00:05 2841 g M Vag-Spont None  LIV  9 Term 02/21/12 [redacted]w[redacted]d 17:33 / 00:27 3799 g M Vag-Spont EPI  LIV  8 Term 02/07/08 [redacted]w[redacted]d  3232 g F Vag-Spont EPI N LIV  7 SAB 10/22/06 [redacted]w[redacted]d       FD  6 Term 06/30/05 [redacted]w[redacted]d  3345 g F Vag-Spont EPI  LIV     Complications: Preeclampsia  5 IAB 2005          4 Term 04/25/01 [redacted]w[redacted]d  3289 g F Vag-Spont EPI  LIV  3 Gravida           2 IAB           1 IAB            Past Surgical History:  Procedure Laterality Date   KNEE LIGAMENT RECONSTRUCTION Right 2003   Family History  Problem Relation Age  of Onset   Asthma Mother    Hypertension Mother    Social History   Tobacco Use   Smoking status: Every Day    Current packs/day: 0.25    Average packs/day: 0.3 packs/day for 3.0 years (0.8 ttl pk-yrs)    Types: Cigarettes   Smokeless tobacco: Never   Tobacco comments:    declines  Substance Use Topics   Alcohol use: Not Currently    Comment: occassionally   Drug use: Not Currently    Types: Marijuana    Comment: Positive one month ago.   No Known Allergies No medications prior to admission.    I have reviewed patient's Past Medical Hx, Surgical Hx, Family Hx, Social Hx, medications and allergies.   ROS  Pertinent items noted in HPI and remainder of comprehensive ROS otherwise negative.   PHYSICAL EXAM  Patient Vitals for the past 24 hrs:  BP Temp Temp src Pulse Resp  SpO2  10/28/23 1840 138/83 98.3 F (36.8 C) Oral 84 16 100 %    Constitutional: Well-developed, well-nourished female in no acute distress.  Cardiovascular: Warm and well-perfused Respiratory: normal effort, no problems with respiration noted GI: Abd soft, non-distended, mild TTP to LLQ MS: Extremities nontender, no edema, normal ROM Neurologic: Alert and oriented x 4.  Pelvic: deferred       Labs: Results for orders placed or performed during the hospital encounter of 10/28/23 (from the past 24 hours)  Pregnancy, urine POC     Status: Abnormal   Collection Time: 10/28/23  6:53 PM  Result Value Ref Range   Preg Test, Ur POSITIVE (A) NEGATIVE  Urinalysis, Routine w reflex microscopic -Urine, Clean Catch     Status: Abnormal   Collection Time: 10/28/23  8:15 PM  Result Value Ref Range   Color, Urine YELLOW YELLOW   APPearance HAZY (A) CLEAR   Specific Gravity, Urine 1.021 1.005 - 1.030   pH 6.0 5.0 - 8.0   Glucose, UA NEGATIVE NEGATIVE mg/dL   Hgb urine dipstick NEGATIVE NEGATIVE   Bilirubin Urine NEGATIVE NEGATIVE   Ketones, ur NEGATIVE NEGATIVE mg/dL   Protein, ur 899 (A) NEGATIVE mg/dL    Nitrite NEGATIVE NEGATIVE   Leukocytes,Ua TRACE (A) NEGATIVE   RBC / HPF 0-5 0 - 5 RBC/hpf   WBC, UA 0-5 0 - 5 WBC/hpf   Bacteria, UA RARE (A) NONE SEEN   Squamous Epithelial / HPF 11-20 0 - 5 /HPF   Mucus PRESENT    Hyaline Casts, UA PRESENT   CBC     Status: Abnormal   Collection Time: 10/28/23  9:01 PM  Result Value Ref Range   WBC 12.4 (H) 4.0 - 10.5 K/uL   RBC 3.62 (L) 3.87 - 5.11 MIL/uL   Hemoglobin 9.7 (L) 12.0 - 15.0 g/dL   HCT 69.9 (L) 63.9 - 53.9 %   MCV 82.9 80.0 - 100.0 fL   MCH 26.8 26.0 - 34.0 pg   MCHC 32.3 30.0 - 36.0 g/dL   RDW 82.6 (H) 88.4 - 84.4 %   Platelets 240 150 - 400 K/uL   nRBC 0.0 0.0 - 0.2 %  Comprehensive metabolic panel with GFR     Status: Abnormal   Collection Time: 10/28/23  9:01 PM  Result Value Ref Range   Sodium 135 135 - 145 mmol/L   Potassium 4.2 3.5 - 5.1 mmol/L   Chloride 106 98 - 111 mmol/L   CO2 19 (L) 22 - 32 mmol/L   Glucose, Bld 81 70 - 99 mg/dL   BUN 8 6 - 20 mg/dL   Creatinine, Ser 9.39 0.44 - 1.00 mg/dL   Calcium 8.1 (L) 8.9 - 10.3 mg/dL   Total Protein 5.7 (L) 6.5 - 8.1 g/dL   Albumin 2.4 (L) 3.5 - 5.0 g/dL   AST 15 15 - 41 U/L   ALT 13 0 - 44 U/L   Alkaline Phosphatase 64 38 - 126 U/L   Total Bilirubin 0.5 0.0 - 1.2 mg/dL   GFR, Estimated >39 >39 mL/min   Anion gap 10 5 - 15  ABO/Rh     Status: None   Collection Time: 10/28/23  9:01 PM  Result Value Ref Range   ABO/RH(D) O POS    No rh immune globuloin      NOT A RH IMMUNE GLOBULIN CANDIDATE, PT RH POSITIVE Performed at [facility], [address] [place], [place] [address]   hCG,  quantitative, pregnancy     Status: Abnormal   Collection Time: 10/28/23  9:01 PM  Result Value Ref Range   hCG, Beta Chain, Quant, S 24,993 (H) <5 mIU/mL    Imaging:  No results found.  MDM & MAU COURSE  MDM: Moderate  MAU Course: Orders Placed This Encounter  Procedures   US  MFM OB COMP + 14 WK   Urinalysis, Routine w reflex microscopic -Urine, Clean Catch   CBC    Comprehensive metabolic panel with GFR   hCG, quantitative, pregnancy   Pregnancy, urine POC   ABO/Rh   Discharge patient   Meds ordered this encounter  Medications   Prenatal 28-0.8 MG TABS    Sig: Take 1 tablet by mouth daily.    Dispense:  30 tablet    Refill:  12   VSS. Bloodwork overall unremarkable. US  showing IUP at [redacted]w[redacted]d. Ddx includes round ligament pain, constipation, muscle strain. Given patient's history, most likely etiology of abdominal pain is round ligament pain. Discussed symptomatic treatment and provided printout. Also given prescription for prenatal vitamin, letter with proof of pregnancy, and list of clinics in the area to assist her with establishing care. All questions answered prior to discharge.   ASSESSMENT   1. [redacted] weeks gestation of pregnancy   2. Round ligament pain     PLAN  Discharge home in stable condition with return precautions.      Allergies as of 10/28/2023   No Known Allergies      Medication List     TAKE these medications    Prenatal 28-0.8 MG Tabs Take 1 tablet by mouth daily.        Charlie Courts, MD  Family Medicine - Obstetrics Fellow

## 2023-10-28 NOTE — Discharge Instructions (Signed)

## 2023-11-19 ENCOUNTER — Ambulatory Visit (HOSPITAL_COMMUNITY): Admission: EM | Admit: 2023-11-19 | Discharge: 2023-11-19 | Disposition: A | Discharge status: Home / Self Care

## 2023-11-19 ENCOUNTER — Encounter (HOSPITAL_COMMUNITY): Payer: Self-pay

## 2023-11-19 DIAGNOSIS — R600 Localized edema: Secondary | ICD-10-CM

## 2023-11-19 NOTE — ED Triage Notes (Signed)
 Patient reports that she has had swelling from the knees to t her feet x 5 days. Patient states she worked 3 10 hour days last week. Patient states the swelling has decreased, but remains swollen.   Patient reports that she had a headache yesterday and took Tylenol  for the headache.

## 2023-11-19 NOTE — Discharge Instructions (Signed)
 I believe your symptoms are due to working on your feet for several hours for several days in a row.  Use ibuprofen  and Tylenol  as needed for pain.  Buy compression socks or tights from [facility] and wear them as often as possible, especially when working on your feet for longer hours.  Elevate your feet above the level of your heart.  Reduce salt intake and drink plenty of water.

## 2023-11-20 NOTE — ED Provider Notes (Signed)
 [facility]    CSN: [id number] Arrival date & time: 11/19/23  1910      History   Chief Complaint Chief Complaint  Patient presents with   Leg Swelling   Headache    HPI Heather Good is a 38 y.o. female.   Pt presents today due to bilateral lower leg and foot swelling after working on her feet for over 10 hrs three days in a row on Thursday, Friday, and Saturday. Pt states that she usually only works about 6 hrs a few days a week but due to homecoming she worked more hours than she ever has before. Pt states that her feet started to hurt the first day and changed into Crocs from her non slip work shoes without significant relief of pain. Pt denies a hx of HTN or use of chronic medications. Pt states that she has noticed some mild decrease in swelling but it has not resolved.    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     08/14/2017 HGSIL again at 35 weeks -> NEEDS COLPOSCOPY and biopsies and ECC!!! Can again be deferred until postpartum and patient will be cautioned about concern about cervical cancer!!     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 Authorizing Provider  Prenatal 28-0.8 MG TABS Take 1 tablet by mouth daily. Patient not taking: Reported on 11/19/2023 10/28/23   Jomarie Charlie LABOR, MD    Family History Family History  Problem Relation Age of Onset   Asthma Mother    Hypertension Mother     Social History Social History   Tobacco Use   Smoking status: Every Day    Current packs/day: 0.25    Average packs/day: 0.3 packs/day for 3.0 years (0.8 ttl pk-yrs)    Types: Cigarettes   Smokeless tobacco: Never   Tobacco comments:    declines  Vaping Use   Vaping status: Never Used  Substance Use Topics   Alcohol use: Not Currently    Comment: occassionally   Drug use: Not Currently    Types: Marijuana    Comment: Positive one month ago.     Allergies   Patient has no known allergies.   Review of Systems Review of Systems  Neurological:  Positive for headaches.  Physical Exam Triage Vital Signs ED Triage Vitals  Encounter Vitals Group     BP 11/19/23 1931 (!) 163/78     Girls Systolic BP Percentile --      Girls Diastolic BP Percentile --      Boys Systolic BP Percentile --      Boys Diastolic BP Percentile --      Pulse Rate 11/19/23 1931 60     Resp 11/19/23 1931 16     Temp 11/19/23 1931 98.3 F (36.8 C)     Temp Source 11/19/23 1931 Oral     SpO2 11/19/23 1931 98 %     Weight --      Height --      Head Circumference --      Peak Flow --      Pain Score 11/19/23 1930 7     Pain Loc --      Pain Education --      Exclude from Growth Chart --    No data found.  Updated Vital Signs BP (!) 163/78 (BP Location: Left Arm)   Pulse 60   Temp 98.3 F (36.8 C) (Oral)   Resp 16   LMP 11/15/2023 (Exact Date)   SpO2 98%   Visual Acuity Right Eye Distance:   Left Eye Distance:   Bilateral Distance:    Right Eye Near:   Left Eye Near:    Bilateral Near:     Physical Exam Vitals and nursing note reviewed.   Constitutional:      General: She is not in acute distress.    Appearance: Normal appearance. She is not ill-appearing, toxic-appearing or diaphoretic.  Eyes:     General: No scleral icterus. Cardiovascular:     Rate and Rhythm: Normal rate and regular rhythm.     Heart sounds: Normal heart sounds.  Pulmonary:     Effort: Pulmonary effort is normal. No respiratory distress.     Breath sounds: Normal breath sounds. No wheezing or rhonchi.  Musculoskeletal:     Right lower leg: 2+ Edema present.     Left lower leg: 2+ Edema present.     Right ankle: Swelling present.     Left ankle: Swelling present.     Right foot: Swelling present.     Left foot: Swelling present.     Comments: Non pitting edema noted bilaterally to the level of mid shin  Skin:    General: Skin is warm.  Neurological:     Mental Status: She is alert and oriented to person, place, and time.  Psychiatric:        Mood and Affect: Mood normal.        Behavior: Behavior normal.      UC Treatments / Results  Labs (all labs ordered are listed, but only abnormal results are displayed) Labs Reviewed - No data to display  EKG   Radiology No results found.  Procedures Procedures (including critical care time)  Medications Ordered in UC Medications - No data to display  Initial Impression / Assessment and Plan / UC Course  I have reviewed the triage vital signs and the nursing notes.  Pertinent labs & imaging results that were available during my care of the patient were reviewed by me and considered in my medical decision making (see chart for details).     Peripheral edema-I believe your symptoms are due to working on your feet for several hours for several days in a row.  Use ibuprofen   and Tylenol  as needed for pain.  Buy compression socks or tights from [employer] and wear them as often as possible, especially when working on your feet for longer hours.  Elevate your feet above the level of your  heart.  Reduce salt intake and drink plenty of water. Final Clinical Impressions(s) / UC Diagnoses   Final diagnoses:  Peripheral edema     Discharge Instructions      I believe your symptoms are due to working on your feet for several hours for several days in a row.  Use ibuprofen  and Tylenol  as needed for pain.  Buy compression socks or tights from [employer] and wear them as often as possible, especially when working on your feet for longer hours.  Elevate your feet above the level of your heart.  Reduce salt intake and drink plenty of water.    ED Prescriptions   None    PDMP not reviewed this encounter.   Andra Corean BROCKS, PA-C 11/20/23 [id number]
